# Patient Record
Sex: Male | Born: 1950 | Race: White | Hispanic: No | Marital: Married | Smoking: Never smoker
Health system: Southern US, Community
[De-identification: ages and names within clinical notes are randomized; demographics above are authoritative.]

## PROBLEM LIST (undated history)

## (undated) DIAGNOSIS — I6509 Occlusion and stenosis of unspecified vertebral artery: Secondary | ICD-10-CM

## (undated) DIAGNOSIS — D6851 Activated protein C resistance: Secondary | ICD-10-CM

## (undated) DIAGNOSIS — Z961 Presence of intraocular lens: Secondary | ICD-10-CM

## (undated) DIAGNOSIS — I639 Cerebral infarction, unspecified: Secondary | ICD-10-CM

## (undated) DIAGNOSIS — N401 Enlarged prostate with lower urinary tract symptoms: Secondary | ICD-10-CM

## (undated) DIAGNOSIS — N200 Calculus of kidney: Secondary | ICD-10-CM

## (undated) DIAGNOSIS — E291 Testicular hypofunction: Secondary | ICD-10-CM

## (undated) DIAGNOSIS — Z86018 Personal history of other benign neoplasm: Secondary | ICD-10-CM

## (undated) DIAGNOSIS — E785 Hyperlipidemia, unspecified: Secondary | ICD-10-CM

## (undated) DIAGNOSIS — E119 Type 2 diabetes mellitus without complications: Secondary | ICD-10-CM

## (undated) HISTORY — PX: TONSILLECTOMY: SUR1361

## (undated) HISTORY — DX: Personal history of other benign neoplasm: Z86.018

## (undated) HISTORY — PX: DENTAL EXAMINATION UNDER ANESTHESIA: SHX1447

## (undated) HISTORY — PX: BACK SURGERY: SHX140

---

## 2005-07-05 ENCOUNTER — Ambulatory Visit: Payer: Self-pay | Admitting: Urology

## 2006-07-31 ENCOUNTER — Ambulatory Visit: Payer: Self-pay | Admitting: Gastroenterology

## 2007-11-06 ENCOUNTER — Ambulatory Visit: Payer: Self-pay | Admitting: Internal Medicine

## 2008-01-30 ENCOUNTER — Ambulatory Visit: Payer: Self-pay

## 2009-09-28 ENCOUNTER — Ambulatory Visit: Payer: Self-pay | Admitting: Gastroenterology

## 2010-06-08 ENCOUNTER — Ambulatory Visit: Payer: Self-pay | Admitting: Urology

## 2010-06-17 ENCOUNTER — Ambulatory Visit: Payer: Self-pay | Admitting: Urology

## 2010-07-07 ENCOUNTER — Ambulatory Visit: Payer: Self-pay | Admitting: Urology

## 2010-07-26 ENCOUNTER — Ambulatory Visit: Payer: Self-pay | Admitting: Urology

## 2010-09-05 ENCOUNTER — Ambulatory Visit: Payer: Self-pay | Admitting: Urology

## 2010-09-08 ENCOUNTER — Ambulatory Visit: Payer: Self-pay | Admitting: Urology

## 2010-09-20 ENCOUNTER — Ambulatory Visit: Payer: Self-pay | Admitting: Urology

## 2011-06-07 ENCOUNTER — Ambulatory Visit: Payer: Self-pay | Admitting: Urology

## 2012-02-13 ENCOUNTER — Observation Stay: Payer: Self-pay | Admitting: Internal Medicine

## 2012-02-13 LAB — CBC
HGB: 14.4 g/dL (ref 13.0–18.0)
MCH: 30.1 pg (ref 26.0–34.0)
MCV: 87 fL (ref 80–100)
Platelet: 191 10*3/uL (ref 150–440)
RBC: 4.79 10*6/uL (ref 4.40–5.90)
WBC: 8.3 10*3/uL (ref 3.8–10.6)

## 2012-02-13 LAB — BASIC METABOLIC PANEL
Calcium, Total: 8.7 mg/dL (ref 8.5–10.1)
Chloride: 104 mmol/L (ref 98–107)
Co2: 24 mmol/L (ref 21–32)
EGFR (African American): >60
EGFR (Non-African Amer.): >60
Glucose: 235 mg/dL — ABNORMAL HIGH (ref 65–99)
Osmolality: 284 (ref 275–301)

## 2012-02-13 LAB — TROPONIN I: Troponin-I: <0.02 ng/mL

## 2012-02-29 DIAGNOSIS — I6509 Occlusion and stenosis of unspecified vertebral artery: Secondary | ICD-10-CM | POA: Insufficient documentation

## 2012-02-29 DIAGNOSIS — E785 Hyperlipidemia, unspecified: Secondary | ICD-10-CM | POA: Insufficient documentation

## 2012-02-29 DIAGNOSIS — I10 Essential (primary) hypertension: Secondary | ICD-10-CM | POA: Insufficient documentation

## 2012-02-29 DIAGNOSIS — I639 Cerebral infarction, unspecified: Secondary | ICD-10-CM | POA: Insufficient documentation

## 2012-05-24 DIAGNOSIS — E119 Type 2 diabetes mellitus without complications: Secondary | ICD-10-CM | POA: Insufficient documentation

## 2012-07-12 DIAGNOSIS — D6851 Activated protein C resistance: Secondary | ICD-10-CM | POA: Insufficient documentation

## 2012-09-02 DIAGNOSIS — N529 Male erectile dysfunction, unspecified: Secondary | ICD-10-CM | POA: Insufficient documentation

## 2012-09-02 DIAGNOSIS — E291 Testicular hypofunction: Secondary | ICD-10-CM | POA: Insufficient documentation

## 2012-09-02 DIAGNOSIS — R81 Glycosuria: Secondary | ICD-10-CM | POA: Insufficient documentation

## 2013-03-06 ENCOUNTER — Ambulatory Visit: Payer: Self-pay | Admitting: Gastroenterology

## 2013-03-07 LAB — PATHOLOGY REPORT

## 2013-04-03 IMAGING — CT CT ANGIO HEAD-NECK
1 of 5 series · 12 of 33 positions shown · IV contrast (APPLIED)
Comparison: none

REASON FOR EXAM: acute onset of worst headache of life, Nausea,
Vomitting, nystagmus, left hemibo
COMMENTS:

PROCEDURE:     CT  - CT ANGIOGRAPHY HEAD NECK W/WO  - February 13, 2012 [DATE]
RESULT:
TECHNIQUE: Axial and coronal source imaging was obtained of the head and
neck regions utilizing helical 3 mm acquisition status post intravenous
administration of 100 ml of Nsovue-W9C.

[Series 4: soft tissue · axial · 0.54mm/px · z∈[-389,-62]mm · 12 of 129 slices shown]
[im 10/129  soft-tissue]
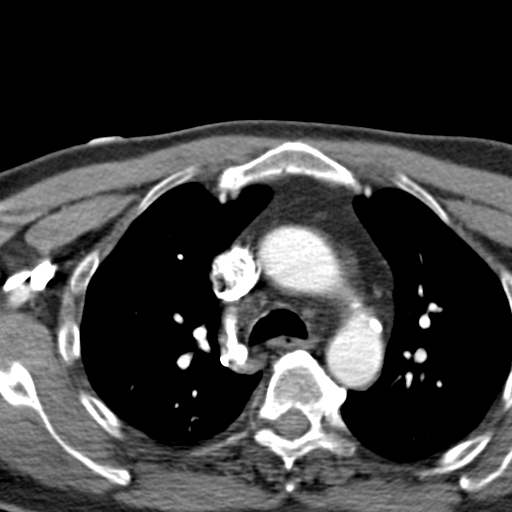
[im 20/129  bone]
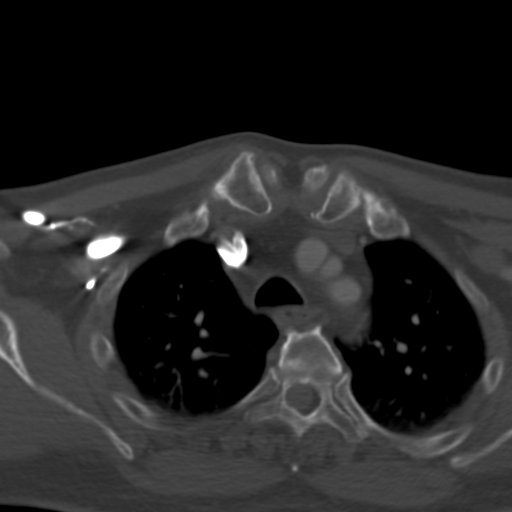
[im 30/129  soft-tissue]
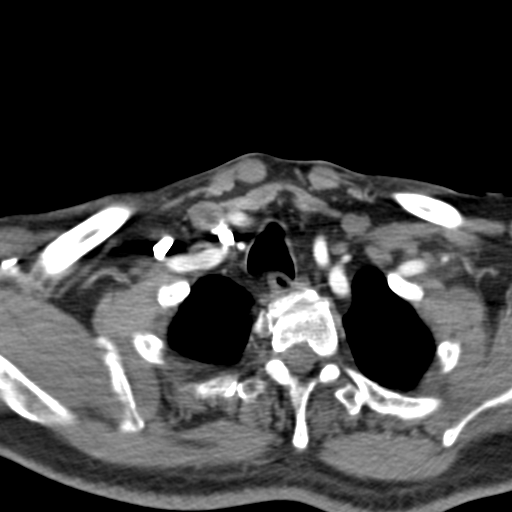
[im 40/129  bone]
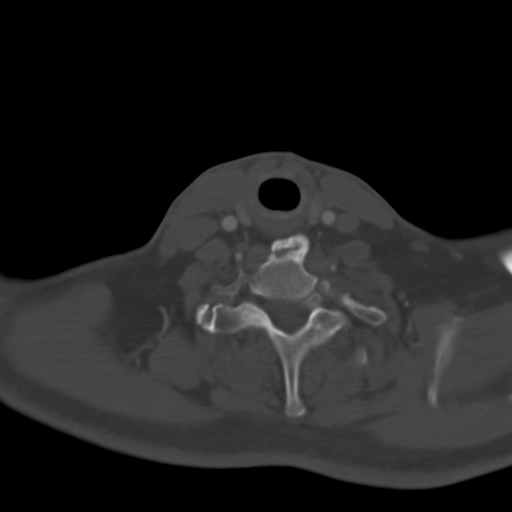
[im 50/129  soft-tissue]
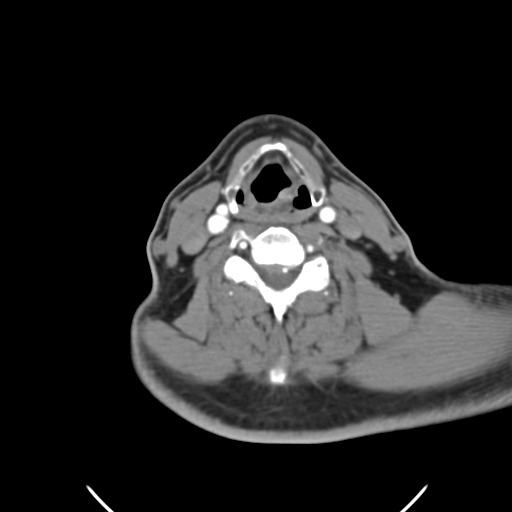
[im 60/129  bone]
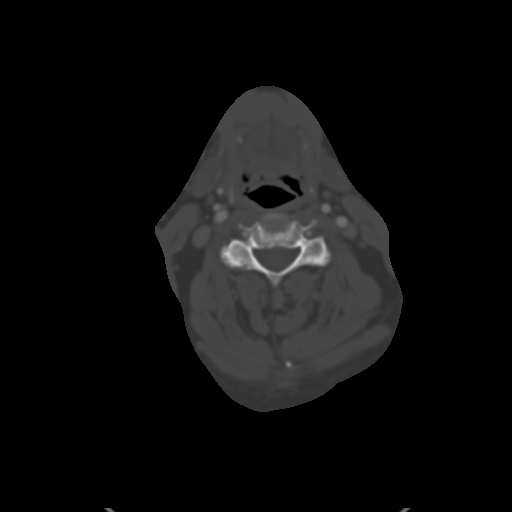
[im 69/129  soft-tissue]
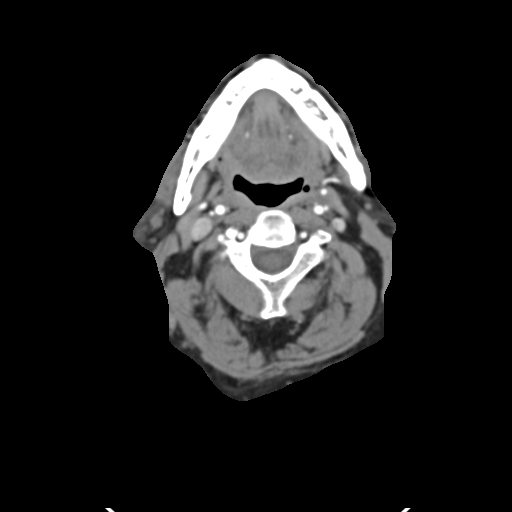
[im 79/129  bone]
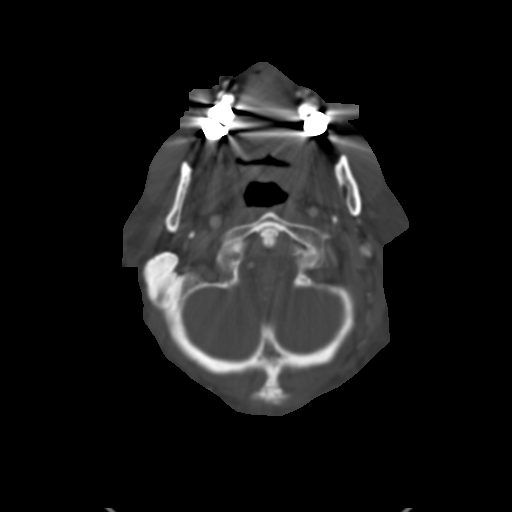
[im 89/129  soft-tissue]
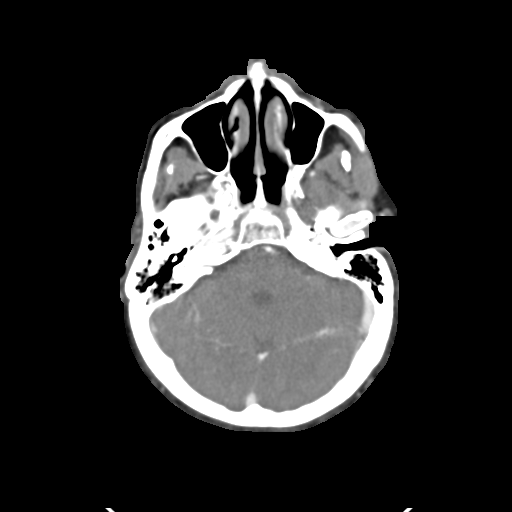
[im 99/129  bone]
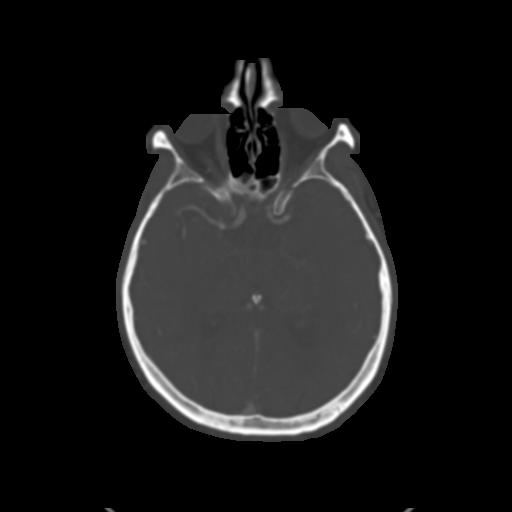
[im 109/129  soft-tissue]
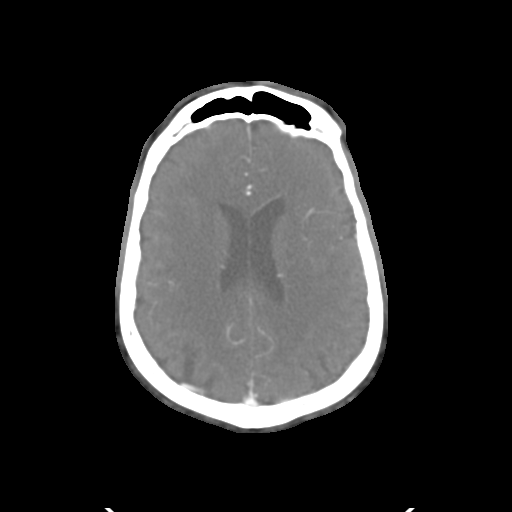
[im 119/129  bone]
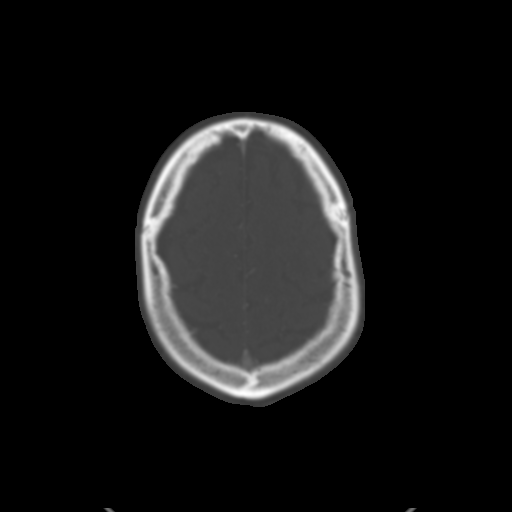

[12 of 33 positions shown; findings below may reference images not displayed]

FINDINGS: There is normal opacification of the carotid and vertebral
arteries with no evidence of stenosis or dissection. No intracranial
stenosis or aneurysmal dilatation is seen. Patent anterior and bilateral
posterior communicating arteries. No abnormal vessel trifurcation is noted.
There is normal opacification of the dominant dural venous sinuses. No
abnormal intracranial enhancement or hydronephrosis appreciated. This is
within the limitations of post contrast imaging. There is no evidence of
intracranial hemorrhage.

No gross osseous abnormalities identified. Degenerative changes are
identified within the cervical spine. The lung apices are clear. No cervical
soft tissue abnormalities are detected.
IMPRESSION: 1.  No acute abnormality detected.
2.  Preliminary faxed report was relayed to the patient's floor on 02/13/2012
at [DATE].

## 2013-06-23 DIAGNOSIS — Z961 Presence of intraocular lens: Secondary | ICD-10-CM | POA: Insufficient documentation

## 2013-08-05 DIAGNOSIS — Z961 Presence of intraocular lens: Secondary | ICD-10-CM | POA: Insufficient documentation

## 2014-11-24 DIAGNOSIS — Z87442 Personal history of urinary calculi: Secondary | ICD-10-CM | POA: Insufficient documentation

## 2014-11-26 ENCOUNTER — Ambulatory Visit: Payer: Self-pay | Admitting: Internal Medicine

## 2015-02-21 NOTE — Discharge Summary (Signed)
PATIENT NAME:  Ryan Hicks, Ryan Hicks MR#:  [id number] DATE OF BIRTH:  [birth year]  DATE OF ADMISSION:  02/13/2012 DATE OF DISCHARGE:  02/14/2012  HISTORY OF PRESENT ILLNESS: Ryan Hicks was a 64 year old white married gentleman who presented to the emergency room after the onset of severe neck and head pain that occurred while he was at work. He felt like the pain radiated into his left eye. He developed extreme vertigo with nausea and vomiting. He also felt like he was numb on the left side of his face. He presented to the emergency room where his symptoms with the exception of the nausea and vomiting had essentially stopped. He did have a head CT in the emergency room that did not show any evidence of a bleed.   PAST MEDICAL HISTORY:  1. Diabetes.  2. Hyperlipidemia.  3. History of herpes zoster. 4. History of hepatitis A.  5. History of nephrolithiasis.  6. Benign prostatic hypertrophy.   PAST SURGICAL HISTORY:  1. Tonsillectomy.  2. Left breast biopsy.  3. Bilateral wrist surgery due to tendon repair.  4. Lumbar laminectomy.   ADMISSION PHYSICAL EXAMINATION: The patient's physical examination revealed a temperature of 97.5, pulse 59, respiratory rate 18, and blood pressure initially of 183/94. Pulse oximetry was 95% on room air. Exam was essentially unremarkable with the exception of the neurological when he initially presented with some left lateral gaze. There is however resolved and at the time of admission was able to keep a midline gaze. His extraocular movements were intact. Notable was the fact that his neck was supple.   HOSPITAL COURSE: The patient was admitted initially to observation. He was seen on an urgent basis by Dr. Manuella Ghazi from neurology. It was Dr. Trena Platt opinion that he most likely had had a brain stem infarct. A CTA was scheduled which was eventually shown to be normal. The patient was therefore scheduled for a MRI the following morning.   Due to the patient's apprehension and  wishes of the family arrangements were made for the patient to be transferred to [facility]. This occurred early in the morning on 02/14/2012.   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 ____________________________ Hewitt Blade. Sarina Ser, MD jbw:slb D: 02/28/2012 12:10:46 ET T: 02/28/2012 12:28:05 ET JOB#: [id number]  cc: Jenny Reichmann B. Sarina Ser, MD, <Dictator> Lottie Mussel III MD ELECTRONICALLY SIGNED 02/28/2012 14:30

## 2015-02-21 NOTE — Consult Note (Signed)
PATIENT NAME:  Ryan Hicks, Ryan Hicks MR#:  [id number] DATE OF BIRTH:  1951/09/17  DATE OF CONSULTATION:  02/13/2012  REFERRING PHYSICIAN:  Dr. Lisette Grinder  CONSULTING PHYSICIAN:  Hemang K. Manuella Ghazi, MD  REASON FOR CONSULTATION: Worst headache of the life and possible left facial weakness.    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 on 02/13/2012.   This was the worst headache of his left. Developed some numbness on the left side of his face.   He cannot tell me exactly when the incoordination of the left side of the body started but he has noticed that his left side is just not quite right. He feels very tired, nausea and vomiting. He feels like he has to keep his eyes closed. He could not tell me early on but later in the exam it seemed like he had dizziness and sensation of movement if he keeps his eyes open. He doesn't has significant sensitivity to light.   There is no change in his hearing or speech or swallowing. He has not noticed any weakness on the right side of the body but he feels like his balance is not good when he tried to sit up in the bed.   He has not noticed any difficulty with his bowel or bladder but he has not needed to urinate yet but he has passed gas per patient.   He has not had any fever or rash. No recent head trauma. He was not in any acute distress. He does not have known high blood pressure. He was noted to be recently started on a new medicine, benazepril, which has been stopped.   Patient does not have family history of cerebral aneurysm as far as he can tell.   He does not have any neck manipulations done, etc.   PAST MEDICAL HISTORY:  1. Benign prostatic hypertrophy.  2. History of kidney stone.  3. Hepatitis A.   4. Herpes  zoster. 5. Diabetes. 6. Hyperlipidemia.   PAST SURGICAL HISTORY:  1. Colonoscopy in 2010. 2. Lumbar spinal surgery due to herniated disk. 3. Bilateral wrist surgery due to tendon repair.  4. Left breast  biopsy and tumor resection which was benign.  5. Tonsillectomy.   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 He does not smoke. He does not drink alcohol. He lives with his wife. His kids and other family members are spread all over the country.    FAMILY HISTORY: Significant father had prostate cancer. Mother had diabetes. She also had hypothyroidism. He has a sister with gestational diabetes.   REVIEW OF SYSTEMS: Positive for severe headache, nausea, vomiting, feeling of dizziness, oscillating sensation if he keeps his eyes open, generalized fatigue, numbness on the left side of the face, incoordination of the left side of the body. Rest of the 10 system review of systems was asked and was found to be negative.   PHYSICAL EXAMINATION:  VITAL SIGNS: Temperature 97.5, pulse 59, respiratory rate 18, blood pressure 183/94, pulse oximetry 95%.   GENERAL: He is a middle-aged Caucasian gentleman lying in bed. His wife at the bedside. He is keeping his eyes closed. The lights were turned off in the room.   LUNGS: Clear to auscultation.   HEART: S1, S2 heart sounds.  NECK: Carotid examination did not reveal any bruit.   He has significant tenderness to touch in his inion just left lateral to it.   His neck is supple.   He has negative Kernig and Brudzinski's sign.   NEUROLOGIC: Examination with his eyes closed mental status he was alert, he was oriented to today's date, day, month, year, place, location, room #141, etc. He knew the current president Obama, previous president Tawni Pummel and he missed the [employer] and said [place] and then [place].   He was able to follow two-step inverted commands (point to the ceiling after pointing to the floor and tiger was killed by lion tell me  who is dead).   His attention, concentration and cognition seems to be intact for his age and medical condition.   He seems to be a little bit apprehensive and worried about stroke.   CRANIAL NERVES: His pupils are reactive but he has a left gaze deviation. He has a hard time keeping the gaze into the center. He can voluntarily do right gaze but cannot persist it.    In his essential gaze he has rotatory nystagmus spontaneous with some horizontal component to it.   He keeps his eyes closed as keeping his eyes open leaves him feeling of oscillopsia.   It was very difficult to check for his visual field as he cannot fixate.   He seemed to have some visual impairment on his right side hemisphere. It was difficult to say.  He does have mild left lower facial weakness. He does have decreased sensation to light touch on the left side of the face. His hearing seemed to be intact. His shoulder shrug seemed to be intact.   On his motor exam he had normal tone. His strength seemed to be actually 5/5 in both upper and lower extremities but he has poor dexterity of his left upper extremity.   On his coordination he had a very poor finger-to-nose on the left compared to the right.   The same as he had difficulty with doing circle motion of his left foot in the air.   His deep tendon reflexes were +2. His toes were mute.   His sensations were intact to light touch but he has some impairment of temperature sensation which he said right side feels warmer than the left side with the tuning fork (side of the tuning fork).  While trying to sit up in the bed he had difficulty due to truncal ataxia.   LABORATORY, DIAGNOSTIC AND RADIOLOGICAL DATA: The CT scan of the head was unremarkable. He does have some intracranial atherosclerosis but no significant previous infarct or space-occupying lesion.   ASSESSMENT AND PLAN: Acute onset of worst headache of the life with negative CT scan of the head and focal  neurological deficit (such as left lower facial weakness and left face numbness, rotational nystagmus with left gaze deviation, left hemibody ataxia and some temperature sensation involvement).   Will obtain CT angio of the cervical disk to look for aneurysm.   Due to his localization into the brainstem and cerebellum I am also concerned about the dissection with his progression of headache. Will obtain CT angiogram of his carotid and vertebral vessels.   I called the radiologist on-call who graciously accepted to do this study on a STAT basis.    I talked to the patient and wife about why we are not giving him TPA as they were interested in giving him thrombus medication but he is  a very high risk for hemorrhage and there is potential for subarachnoid hemorrhage.   I did offer them lumbar puncture, but right now will do the aneurysm and dissection studies on family's direction as well.    I talked to them that we are holding off on any antiplatelet as well due to concern for dissection or subarachnoid hemorrhage.   If he happens to have dissection then will consider starting IV heparin.   If he has aneurysm then he might need to get transferred to tertiary care facility where he can have some intervention done.   He has had MRI of the brain ordered.   I will follow up these studies tonight. I talked to Dr. Gilford Rile on the phone and nurse as well as family.   TIME SPENT: I spent 75 minutes of critical care time with this patient who has a potential life threatening event (brain stem ischemia).   Feel free to contact me with any further questions.  ____________________________ Royetta Crochet. Manuella Ghazi, MD hks:cms D: 02/13/2012 22:18:00 ET T: 02/14/2012 07:40:35 ET JOB#: [id number]  cc: Hemang K. Manuella Ghazi, MD, <Dictator> John B. Sarina Ser, MD Maudry Diego Raliegh Ip [facility] MD ELECTRONICALLY SIGNED 02/25/2012 21:25

## 2015-02-21 NOTE — H&P (Signed)
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 He states that it radiated into the left side of his head and into his left eye. He developed nausea and vomiting and presented to the Emergency Room for evaluation. CT scan of the head in the Emergency Room was negative for acute bleed. Apparently there was some question by the ER physician of a mild left facial droop. Admission was therefore requested.   PAST MEDICAL HISTORY:  1. Type 2 diabetes.  2. Hyperlipidemia.  3. Benign prostatic hypertrophy.  4. Obesity.  5. History of nephrolithiasis.  6. History of hepatitis A.  7. History of herpes zoster.   PAST SURGICAL HISTORY:  1. Tonsillectomy.  2. Bilateral wrist surgery for tendon repair.  3. Lumbar spinal surgery.   MEDICATIONS ON ADMISSION:  1. Flomax 0.4 mg daily.  2. Metformin 1000 mg b.i.d.  3. Simvastatin 40 mg at bedtime.   ALLERGIES: No known drug allergies.   SOCIAL HISTORY: He is the Theme park manager at [employer]. He does not smoke. He is a social drinker.   FAMILY HISTORY: The patient's family history is notable that his father had prostate cancer. His mother had diabetes. She also had hypothyroidism. He has a sister who had gestational diabetes. There is no history of cerebrovascular accident. There is no history of migraine.   REVIEW OF SYSTEMS: The patient's review of systems is essentially unremarkable with the exception of the present illness.   PHYSICAL EXAMINATION:  VITAL SIGNS:  Temperature 97.8, pulse 70, blood pressure 163/96, and a pulse oximetry of 94.   GENERAL: This is a middle-aged gentleman who looks like he feels poorly, although his nausea and headache have for the most part subsided.   SKIN: Normal in  color and texture. There is no lymphadenopathy.   HEENT: Examination of head, ears, eyes, nose and throat is grossly within normal limits.   NECK: The neck is supple. There are no carotid bruits.   LUNGS: The lung fields are clear to auscultation.   CARDIAC: Exam reveals a regular rhythm. There are no murmurs or gallops. S1, S2 were normal.   ABDOMEN: Soft and nontender. Liver and spleen are not enlarged. Bowel sounds are normal.   GENITAL/RECTAL: Deferred.   EXTREMITIES: No edema or deformity.   NEUROLOGIC: Exam shows the cranial nerves to be grossly intact. I do not see any obvious nerve palsies. The patient moves all extremities well. Grip strength is normal in both hands. There are no Babinski's.   LABORATORY, DIAGNOSTIC AND RADIOLOGICAL DATA:  The patient's unenhanced head CT showed no acute changes.  Admission CBC shows a hemoglobin of 14.4, with a hematocrit of 41.8. White count is 8300. Platelet count is 191,000.  Admission basic metabolic panel shows a random blood sugar of 235.  Troponin is less than 0.02.   IMPRESSION: Acute onset of severe headache of undetermined etiology.   PLAN: The patient is being admitted initially to Observation. He will be placed on full liquids to make sure his nausea is completely cleared. He will be placed on a sliding scale of insulin until his nausea has resolved. His routine medications will be held until we are sure his nausea has resolved. He we will have neuro checks every  two hours. Neurology consultation will be requested. The MRI is scheduled for in the morning.   ____________________________ Hewitt Blade. Sarina Ser, MD jbw:cbb D: 02/13/2012 17:52:04 ET T: 02/13/2012 18:21:10 ET JOB#: [id number]  cc: Jenny Reichmann B. Sarina Ser, MD, <Dictator> Lottie Mussel III MD ELECTRONICALLY SIGNED 02/18/2012 17:30

## 2015-02-21 NOTE — Consult Note (Signed)
Referring Physician:  Madelyn Brunner :   Past Medical/Surgical Hx:  Renal Insufficiency:   Diabetes Mellitus, Type II (NIDD):   Home Medications: Medication Instructions Last Modified Date/Time  vitamin A 1 cap(s) orally once a day. **pt not sure of strength** 16-Apr-13 17:19  Vitamin B1 1 tab(s) orally once a day. **pt not sure of strength** 16-Apr-13 17:19  Vitamin C 500 mg oral tablet 1 tab(s) orally once a day 16-Apr-13 17:19  Vitamin D3 1 tab(s) orally once a day. **pt not sure of strength** 16-Apr-13 17:19  vitamin E 400 intl units oral capsule 1 cap(s) orally once a day 16-Apr-13 17:19  tamsulosin 0.4 mg oral capsule 1 cap(s) orally once a day.  **brand name flomax** 16-Apr-13 17:19  metformin 500 mg oral tablet 1 tab(s) orally 4 times a day 16-Apr-13 17:19  benazepril 20 mg oral tablet 1 tab(s) orally once a day 16-Apr-13 17:19   Allergies:  No Known Allergies:   Vital Signs: **Vital Signs.:   16-Apr-13 19:51   Vital Signs Type Admission   Temperature Temperature (F) 97.3   Celsius 36.2   Temperature Source oral   Pulse Pulse 58   Pulse source per Dinamap   Respirations Respirations 18   Systolic BP Systolic BP 010   Diastolic BP (mmHg) Diastolic BP (mmHg) 91   Mean BP 119   BP Source Dinamap   Pulse Ox % Pulse Ox % 95   Pulse Ox Activity Level  At rest   Oxygen Delivery Room Air/ 21 %   Lab Results: Routine Chem:  16-Apr-13 15:56    Glucose, Serum   235   BUN 9   Creatinine (comp) 0.77   Sodium, Serum 139   Potassium, Serum 4.2   Chloride, Serum 104   CO2, Serum 24   Calcium (Total), Serum 8.7   Anion Gap 11   Osmolality (calc) 284   eGFR (African American) >60   eGFR (Non-African American) >60  Cardiac:  16-Apr-13 15:56    Troponin I < 0.02  Routine Hem:  16-Apr-13 15:56    WBC (CBC) 8.3   RBC (CBC) 4.79   Hemoglobin (CBC) 14.4   Hematocrit (CBC) 41.8   Platelet Count (CBC) 191   MCV 87   MCH 30.1   MCHC 34.4   RDW 12.7   Radiology  Results: CT:    16-Apr-13 16:36, CT Head Without Contrast   CT Head Without Contrast    REASON FOR EXAM:    headache  COMMENTS:       PROCEDURE: CT  - CT HEAD WITHOUT CONTRAST  - Feb 13 2012  4:36PM     RESULT: Technique: Helical 10m sections were obtained from the skull base   to the vertex without administration of intravenous contrast.     Findings: There is not evidence of intra-axial fluid collections. There   is no evidence of acute hemorrhage or secondary signs reflecting mass   effect or subacute or chronic focal territorial infarction. The osseous   structures demonstrate no evidence of a depressed skull fracture. If   there is persistent concern clinical follow-up with MRI is recommended.    IMPRESSION:   1. No evidence of acute intracranial abnormalitites.          Verified By: HMikki Santee M.D., MD   Impression/Recommendations:  Recommendations:   Please see my dictation for details.Acute onset of left upper neck, occipital pain - worst headache of life, radiated to left eye, now  with left hemibody ataxia, mild left facial weakness, left hemifacial numbness, some abnormality of temp sensation, nystagmus, left gaze deviation, severe nausea vomitting, neck supple. non contrast CT head - no hemorrhage. for Sub Arachnoid Hemorrhage. lumbar puncture - family and pt will prefer to go with CTA first to rule out dissection / aneurysm. Has paged radiologist on call to get CTA done. renal function OK. CTA vertebral (dissection), circle of willis (aneurysm). Pt and family explained why not tPA and why no anti-platelets at present. for the opportunity to participate in care of your patient. spent 75 min of critical care time for this patient (>50% of the time couselling and co-ordination of care - calling Dr. Gilford Rile, radiologist, nurses etc).is critically ill and has potential for life threatening event due to brainstem ischemia / stroke.   Electronic Signatures: Ray Church (MD)  (Signed 16-Apr-13 20:50)  Authored: REFERRING PHYSICIAN, PAST MEDICAL/SURGICAL HISTORY, HOME MEDICATIONS, ALLERGIES, NURSING VITAL SIGNS, LAB RESULTS, RADIOLOGY RESULTS, Recommendations   Last Updated: 16-Apr-13 20:50 by Ray Church (MD)

## 2016-02-24 ENCOUNTER — Other Ambulatory Visit: Payer: Self-pay | Admitting: Orthopedic Surgery

## 2016-02-24 DIAGNOSIS — M25512 Pain in left shoulder: Secondary | ICD-10-CM

## 2016-03-01 ENCOUNTER — Ambulatory Visit
Admission: RE | Admit: 2016-03-01 | Discharge: 2016-03-01 | Disposition: A | Discharge status: Home / Self Care | Payer: BLUE CROSS/BLUE SHIELD | Source: Ambulatory Visit | Attending: Orthopedic Surgery | Admitting: Orthopedic Surgery

## 2016-03-01 DIAGNOSIS — M25512 Pain in left shoulder: Secondary | ICD-10-CM

## 2016-05-01 DIAGNOSIS — N200 Calculus of kidney: Secondary | ICD-10-CM | POA: Insufficient documentation

## 2017-08-20 DIAGNOSIS — Z86018 Personal history of other benign neoplasm: Secondary | ICD-10-CM

## 2017-08-20 HISTORY — DX: Personal history of other benign neoplasm: Z86.018

## 2018-04-01 ENCOUNTER — Ambulatory Visit (INDEPENDENT_AMBULATORY_CARE_PROVIDER_SITE_OTHER): Payer: Medicare PPO | Admitting: Urology

## 2018-04-01 ENCOUNTER — Encounter: Payer: Self-pay | Admitting: Urology

## 2018-04-01 VITALS — BP 147/77 | HR 88 | Ht 72.0 in | Wt 237.0 lb

## 2018-04-01 DIAGNOSIS — N401 Enlarged prostate with lower urinary tract symptoms: Secondary | ICD-10-CM | POA: Insufficient documentation

## 2018-04-01 DIAGNOSIS — N138 Other obstructive and reflux uropathy: Secondary | ICD-10-CM | POA: Insufficient documentation

## 2018-04-01 NOTE — Progress Notes (Signed)
04/01/2018 9:12 AM   Ryan Hicks Oct 13, 1951 [id number]  Referring provider: No referring provider defined for this encounter.  Chief Complaint  Patient presents with  . Benign Prostatic Hypertrophy   Urologic problem list: -BPH with lower urinary tract symptoms; tamsulosin prn  HPI: 67 year old male presents for annual follow-up.  He takes tamsulosin as needed for postvoid dribbling.  A PSA performed with his PCP January 2019 was stable at 0.71.  He has no complaints today.   PMH: History reviewed. No pertinent past medical history.  Surgical History: History reviewed. No pertinent surgical history.  Home Medications:  Allergies as of 04/01/2018   No Known Allergies     Medication List        Accurate as of 04/01/18  9:12 AM. Always use your most recent med list.          aspirin EC 81 MG tablet Take by mouth.   atorvastatin 80 MG tablet Commonly known as:  LIPITOR TAKE 1 TABLET BY MOUTH EVERY NIGHT   benazepril 40 MG tablet Commonly known as:  LOTENSIN   clobetasol 0.05 % external solution Commonly known as:  TEMOVATE   DELTA D3 400 units Tabs tablet Generic drug:  cholecalciferol Take by mouth.   DENTA 5000 PLUS 1.1 % Crea dental cream Generic drug:  sodium fluoride   ELIDEL 1 % cream Generic drug:  pimecrolimus   FISH OIL MAXIMUM STRENGTH 1200 MG Cpdr Take by mouth.   fluticasone 50 MCG/ACT nasal spray Commonly known as:  FLONASE Place into the nose.   glipiZIDE 10 MG tablet Commonly known as:  GLUCOTROL   JANUVIA 25 MG tablet Generic drug:  sitaGLIPtin   Magnesium 250 MG Tabs Take by mouth.   metFORMIN 1000 MG tablet Commonly known as:  GLUCOPHAGE   MULTI-VITAMINS Tabs Take by mouth.   omeprazole 20 MG capsule Commonly known as:  PRILOSEC Take by mouth.   OXISTAT 1 % lotion Generic drug:  oxiconazole   tamsulosin 0.4 MG Caps capsule Commonly known as:  FLOMAX   VIAGRA 100 MG tablet Generic drug:  sildenafil Take by  mouth.   vitamin A 8000 UNIT capsule Take by mouth.   vitamin B-12 1000 MCG tablet Commonly known as:  CYANOCOBALAMIN Take by mouth.   vitamin C 500 MG tablet Commonly known as:  ASCORBIC ACID Take by mouth.       Allergies: No Known Allergies  Family History: History reviewed. No pertinent family history.  Social History:  reports that he has never smoked. He has never used smokeless tobacco. He reports that he does not drink alcohol or use drugs.  ROS: UROLOGY Frequent Urination?: No Hard to postpone urination?: No Burning/pain with urination?: No Get up at night to urinate?: No Leakage of urine?: No Urine stream starts and stops?: No Trouble starting stream?: No Do you have to strain to urinate?: No Blood in urine?: No Urinary tract infection?: No Sexually transmitted disease?: No Injury to kidneys or bladder?: No Painful intercourse?: No Weak stream?: No Erection problems?: No Penile pain?: No  Gastrointestinal Nausea?: No Vomiting?: No Indigestion/heartburn?: No Diarrhea?: No Constipation?: No  Constitutional Fever: No Night sweats?: No Weight loss?: No Fatigue?: No  Skin Skin rash/lesions?: No Itching?: No  Eyes Blurred vision?: No Double vision?: No  Ears/Nose/Throat Sore throat?: No Sinus problems?: No  Hematologic/Lymphatic Swollen glands?: No Easy bruising?: No  Cardiovascular Leg swelling?: No Chest pain?: No  Respiratory Cough?: No Shortness of breath?: No  Endocrine Excessive  thirst?: No  Musculoskeletal Back pain?: No Joint pain?: No  Neurological Headaches?: No Dizziness?: No  Psychologic Depression?: No Anxiety?: No  Physical Exam: BP (!) 147/77 (BP Location: Left Arm, Patient Position: Sitting, Cuff Size: Large)   Pulse 88   Ht 6' (1.829 m)   Wt 237 lb (107.5 kg)   SpO2 99%   BMI 32.14 kg/m   Constitutional:  Alert and oriented, No acute distress. HEENT: [place] AT, moist mucus membranes.  Trachea  midline, no masses. Cardiovascular: No clubbing, cyanosis, or edema. Respiratory: Normal respiratory effort, no increased work of breathing. GI: Abdomen is soft, nontender, nondistended, no abdominal masses GU: No CVA tenderness.  Prostate 50 g, smooth without nodules. Lymph: No cervical or inguinal lymphadenopathy. Skin: No rashes, bruises or suspicious lesions. Neurologic: Grossly intact, no focal deficits, moving all 4 extremities. Psychiatric: Normal mood and affect.  Assessment & Plan:   66 year old male with BPH/lower urinary tract symptoms.  DRE benign and PSA remains low.  He will continue tamsulosin as needed.  Continue annual follow-up.  Return in about 1 year (around 04/02/2019) for Recheck.  Abbie Sons, [place] [address], [place], [place] [address] [phone]

## 2018-07-22 ENCOUNTER — Encounter: Payer: Self-pay | Admitting: *Deleted

## 2018-07-23 ENCOUNTER — Ambulatory Visit: Payer: Medicare PPO | Admitting: Certified Registered Nurse Anesthetist

## 2018-07-23 ENCOUNTER — Encounter
Admission: RE | Disposition: A | Discharge status: Home / Self Care | Payer: Self-pay | Source: Ambulatory Visit | Attending: Internal Medicine

## 2018-07-23 ENCOUNTER — Ambulatory Visit
Admission: RE | Admit: 2018-07-23 | Discharge: 2018-07-23 | Disposition: A | Discharge status: Home / Self Care | Payer: Medicare PPO | Source: Ambulatory Visit | Attending: Internal Medicine | Admitting: Internal Medicine

## 2018-07-23 ENCOUNTER — Encounter: Payer: Self-pay | Admitting: *Deleted

## 2018-07-23 DIAGNOSIS — I10 Essential (primary) hypertension: Secondary | ICD-10-CM | POA: Diagnosis not present

## 2018-07-23 DIAGNOSIS — Z79899 Other long term (current) drug therapy: Secondary | ICD-10-CM | POA: Diagnosis not present

## 2018-07-23 DIAGNOSIS — Z8673 Personal history of transient ischemic attack (TIA), and cerebral infarction without residual deficits: Secondary | ICD-10-CM | POA: Diagnosis not present

## 2018-07-23 DIAGNOSIS — Z86010 Personal history of colonic polyps: Secondary | ICD-10-CM | POA: Insufficient documentation

## 2018-07-23 DIAGNOSIS — D6851 Activated protein C resistance: Secondary | ICD-10-CM | POA: Insufficient documentation

## 2018-07-23 DIAGNOSIS — Z7984 Long term (current) use of oral hypoglycemic drugs: Secondary | ICD-10-CM | POA: Diagnosis not present

## 2018-07-23 DIAGNOSIS — E785 Hyperlipidemia, unspecified: Secondary | ICD-10-CM | POA: Insufficient documentation

## 2018-07-23 DIAGNOSIS — Z09 Encounter for follow-up examination after completed treatment for conditions other than malignant neoplasm: Secondary | ICD-10-CM | POA: Diagnosis present

## 2018-07-23 DIAGNOSIS — K5730 Diverticulosis of large intestine without perforation or abscess without bleeding: Secondary | ICD-10-CM | POA: Insufficient documentation

## 2018-07-23 DIAGNOSIS — N401 Enlarged prostate with lower urinary tract symptoms: Secondary | ICD-10-CM | POA: Insufficient documentation

## 2018-07-23 DIAGNOSIS — E291 Testicular hypofunction: Secondary | ICD-10-CM | POA: Insufficient documentation

## 2018-07-23 DIAGNOSIS — E119 Type 2 diabetes mellitus without complications: Secondary | ICD-10-CM | POA: Diagnosis not present

## 2018-07-23 DIAGNOSIS — Z7982 Long term (current) use of aspirin: Secondary | ICD-10-CM | POA: Diagnosis not present

## 2018-07-23 HISTORY — DX: Testicular hypofunction: E29.1

## 2018-07-23 HISTORY — DX: Activated protein C resistance: D68.51

## 2018-07-23 HISTORY — PX: COLONOSCOPY WITH PROPOFOL: SHX5780

## 2018-07-23 HISTORY — DX: Hyperlipidemia, unspecified: E78.5

## 2018-07-23 HISTORY — DX: Calculus of kidney: N20.0

## 2018-07-23 HISTORY — DX: Benign prostatic hyperplasia with lower urinary tract symptoms: N40.1

## 2018-07-23 HISTORY — DX: Cerebral infarction, unspecified: I63.9

## 2018-07-23 HISTORY — DX: Presence of intraocular lens: Z96.1

## 2018-07-23 HISTORY — DX: Occlusion and stenosis of unspecified vertebral artery: I65.09

## 2018-07-23 HISTORY — DX: Type 2 diabetes mellitus without complications: E11.9

## 2018-07-23 LAB — GLUCOSE, CAPILLARY: GLUCOSE-CAPILLARY: 233 mg/dL — AB (ref 70–99)

## 2018-07-23 SURGERY — COLONOSCOPY WITH PROPOFOL
Anesthesia: General

## 2018-07-23 MED ORDER — PROPOFOL 500 MG/50ML IV EMUL
INTRAVENOUS | Status: DC | PRN
  Administered 2018-07-23: 175 ug/kg/min via INTRAVENOUS

## 2018-07-23 MED ORDER — PROPOFOL 10 MG/ML IV BOLUS
INTRAVENOUS | Status: DC | PRN
  Administered 2018-07-23: 50 mg via INTRAVENOUS

## 2018-07-23 MED ORDER — PROPOFOL 500 MG/50ML IV EMUL
INTRAVENOUS | Status: AC
  Filled 2018-07-23: qty 50

## 2018-07-23 MED ORDER — SODIUM CHLORIDE 0.9 % IV SOLN
INTRAVENOUS | Status: DC
  Administered 2018-07-23: 10:00:00 via INTRAVENOUS

## 2018-07-23 MED ORDER — LIDOCAINE HCL (CARDIAC) PF 100 MG/5ML IV SOSY
PREFILLED_SYRINGE | INTRAVENOUS | Status: DC | PRN
  Administered 2018-07-23: 50 mg via INTRAVENOUS

## 2018-07-23 NOTE — Anesthesia Procedure Notes (Signed)
Date/Time: 07/23/2018 11:08 AM Performed by: Johnna Acosta, CRNA Pre-anesthesia Checklist: Patient identified, Emergency Drugs available, Suction available, Patient being monitored and Timeout performed Patient Re-evaluated:Patient Re-evaluated prior to induction Oxygen Delivery Method: Nasal cannula Preoxygenation: Pre-oxygenation with 100% oxygen Induction Type: IV induction

## 2018-07-23 NOTE — Anesthesia Postprocedure Evaluation (Signed)
Anesthesia Post Note  Patient: Ryan Hicks  Procedure(s) Performed: COLONOSCOPY WITH PROPOFOL (N/A )  Patient location during evaluation: PACU Anesthesia Type: General Level of consciousness: awake and alert Pain management: pain level controlled Vital Signs Assessment: post-procedure vital signs reviewed and stable Respiratory status: spontaneous breathing, nonlabored ventilation, respiratory function stable and patient connected to nasal cannula oxygen Cardiovascular status: blood pressure returned to baseline and stable Postop Assessment: no apparent nausea or vomiting Anesthetic complications: no     Last Vitals:  Vitals:   07/23/18 1200 07/23/18 1206  BP:  116/86  Pulse: 61 64  Resp: 14 14  Temp:    SpO2: 96% 97%    Last Pain:  Vitals:   07/23/18 1135  TempSrc: Tympanic  PainSc:                  Durenda Hurt

## 2018-07-23 NOTE — Op Note (Signed)
[facility] Gastroenterology Patient Name: Ryan Hicks Procedure Date: 07/23/2018 11:05 AM MRN: [id number] Account #: [id number] Date of Birth: 03/14/1951 Admit Type: Outpatient Age: 67 Room: [facility] ENDO ROOM 2 Gender: Male Note Status: Finalized Procedure:            Colonoscopy Indications:          High risk colon cancer surveillance: Personal history                        of colonic polyps Providers:            Benay Pike. Tavon Magnussen MD, MD Medicines:            Propofol per Anesthesia Complications:        No immediate complications. Procedure:            Pre-Anesthesia Assessment:                       - The risks and benefits of the procedure and the                        sedation options and risks were discussed with the                        patient. All questions were answered and informed                        consent was obtained.                       - Patient identification and proposed procedure were                        verified prior to the procedure by the nurse. The                        procedure was verified in the procedure room.                       - ASA Grade Assessment: II - A patient with mild                        systemic disease.                       - After reviewing the risks and benefits, the patient                        was deemed in satisfactory condition to undergo the                        procedure.                       After obtaining informed consent, the colonoscope was                        passed under direct vision. Throughout the procedure,                        the patient's blood pressure, pulse, and oxygen  saturations were monitored continuously. The                        Colonoscope was introduced through the anus and                        advanced to the the cecum, identified by appendiceal                        orifice and ileocecal valve. The colonoscopy was         performed without difficulty. The patient tolerated the                        procedure well. The quality of the bowel preparation                        was good. Findings:      The perianal and digital rectal examinations were normal. Pertinent       negatives include normal sphincter tone and no palpable rectal lesions.      Many small-mouthed diverticula were found in the sigmoid colon.      The exam was otherwise without abnormality on direct and retroflexion       views. Impression:           - Diverticulosis in the sigmoid colon.                       - The examination was otherwise normal on direct and                        retroflexion views.                       - No specimens collected. Recommendation:       - Patient has a contact number available for                        emergencies. The signs and symptoms of potential                        delayed complications were discussed with the patient.                        Return to normal activities tomorrow. Written discharge                        instructions were provided to the patient.                       - Resume previous diet.                       - Continue present medications.                       - Repeat colonoscopy in 10 years for screening purposes.                       - Return to GI office PRN.                       -  The findings and recommendations were discussed with                        the patient and their spouse. Procedure Code(s):    --- Professional ---                       Y8185, Colorectal cancer screening; colonoscopy on                        individual at high risk Diagnosis Code(s):    --- Professional ---                       K57.30, Diverticulosis of large intestine without                        perforation or abscess without bleeding                       Z86.010, Personal history of colonic polyps CPT copyright 2017 American Medical Association. All rights reserved. The  codes documented in this report are preliminary and upon coder review may  be revised to meet current compliance requirements. Efrain Sella MD, MD 07/23/2018 11:33:41 AM This report has been signed electronically. Number of Addenda: 0 Note Initiated On: 07/23/2018 11:05 AM Scope Withdrawal Time: 0 hours 7 minutes 49 seconds  Total Procedure Duration: 0 hours 17 minutes 8 seconds       [facility]

## 2018-07-23 NOTE — Interval H&P Note (Signed)
History and Physical Interval Note:  07/23/2018 10:58 AM  Ryan Hicks  has presented today for surgery, with the diagnosis of PRS HX COLON POLYPS  The various methods of treatment have been discussed with the patient and family. After consideration of risks, benefits and other options for treatment, the patient has consented to  Procedure(s): COLONOSCOPY WITH PROPOFOL (N/A) as a surgical intervention .  The patient's history has been reviewed, patient examined, no change in status, stable for surgery.  I have reviewed the patient's chart and labs.  Questions were answered to the patient's satisfaction.     [place], [place]

## 2018-07-23 NOTE — Anesthesia Post-op Follow-up Note (Signed)
Anesthesia QCDR form completed.        

## 2018-07-23 NOTE — H&P (Signed)
Outpatient short stay form Pre-procedure 07/23/2018 10:57 AM Ryan Hicks, M.D.  Primary Physician: Harrel Lemon, M.D.  Reason for visit: Personal hx of colon polyps.  History of present illness:  Patient presents for colonoscopy for  Personal hx of colon polyps.The patient denies complaints of abdominal pain, significant change in bowel habits, or rectal bleeding.     Current Facility-Administered Medications:  .  0.9 %  sodium chloride infusion, , Intravenous, Continuous, [place], Benay Pike, MD, Last Rate: 20 mL/hr at 07/23/18 1028  Medications Prior to Admission  Medication Sig Dispense Refill Last Dose  . aspirin EC 81 MG tablet Take by mouth.   Past Week at Unknown time  . atorvastatin (LIPITOR) 80 MG tablet TAKE 1 TABLET BY MOUTH EVERY NIGHT   Past Week at Unknown time  . benazepril (LOTENSIN) 40 MG tablet    Past Week at Unknown time  . cholecalciferol (DELTA D3) 400 units TABS tablet Take by mouth.   Past Week at Unknown time  . clobetasol (TEMOVATE) 0.05 % external solution   1 Past Week at Unknown time  . fluticasone (FLONASE) 50 MCG/ACT nasal spray Place into the nose.   Past Week at Unknown time  . glipiZIDE (GLUCOTROL) 10 MG tablet    Past Week at Unknown time  . glucosamine-chondroitin 500-400 MG tablet Take 1 tablet by mouth 3 (three) times daily.   Past Week at Unknown time  . HYDROcodone-acetaminophen (NORCO/VICODIN) 5-325 MG tablet Take 1 tablet by mouth every 6 (six) hours as needed for moderate pain.   Past Week at Unknown time  . JANUVIA 25 MG tablet    Past Week at Unknown time  . Magnesium 250 MG TABS Take by mouth.   Past Week at Unknown time  . metFORMIN (GLUCOPHAGE) 1000 MG tablet    Past Week at Unknown time  . Multiple Vitamin (MULTI-VITAMINS) TABS Take by mouth.   Past Week at Unknown time  . Omega-3 Fatty Acids (FISH OIL MAXIMUM STRENGTH) 1200 MG CPDR Take by mouth.   Past Week at Unknown time  . omeprazole (PRILOSEC) 20 MG capsule Take by mouth.    Past Week at Unknown time  . oxiconazole (OXISTAT) 1 % lotion    Past Week at Unknown time  . pimecrolimus (ELIDEL) 1 % cream    Past Week at Unknown time  . saw palmetto 160 MG capsule Take 160 mg by mouth 2 (two) times daily.   Past Week at Unknown time  . sildenafil (VIAGRA) 100 MG tablet Take by mouth.   Past Week at Unknown time  . sodium fluoride (DENTA 5000 PLUS) 1.1 % CREA dental cream    Past Week at Unknown time  . tamsulosin (FLOMAX) 0.4 MG CAPS capsule    Past Week at Unknown time  . vitamin A 8000 UNIT capsule Take by mouth.   Past Week at Unknown time  . vitamin B-12 (CYANOCOBALAMIN) 1000 MCG tablet Take by mouth.   Past Week at Unknown time  . vitamin C (ASCORBIC ACID) 500 MG tablet Take by mouth.   Past Week at Unknown time  . vitamin E 400 UNIT capsule Take 400 Units by mouth daily.   Past Week at Unknown time     Not on File   Past Medical History:  Diagnosis Date  . Diabetes mellitus without complication ([place])   . Enlarged prostate with lower urinary tract symptoms (LUTS)   . Heterozygous factor V Leiden mutation ([place])   . Hyperlipidemia   .  Hypertension   . Nephrolithiasis   . Pseudophakia of both eyes   . Stroke ([place])   . Testicular hypofunction   . Vertebral artery occlusion     Review of systems:  Otherwise negative.    Physical Exam  Gen: Alert, oriented. Appears stated age.  HEENT: [place]/AT. PERRLA. Lungs: CTA, no wheezes. CV: RR nl S1, S2. Abd: soft, benign, no masses. BS+ Ext: No edema. Pulses 2+    Planned procedures: Proceed with colonoscopy. The patient understands the nature of the planned procedure, indications, risks, alternatives and potential complications including but not limited to bleeding, infection, perforation, damage to internal organs and possible oversedation/side effects from anesthesia. The patient agrees and gives consent to proceed.  Please refer to procedure notes for findings, recommendations and patient  disposition/instructions.     Ryan Hicks, M.D. Gastroenterology 07/23/2018  10:57 AM

## 2018-07-23 NOTE — Transfer of Care (Signed)
Immediate Anesthesia Transfer of Care Note  Patient: Ryan Hicks  Procedure(s) Performed: COLONOSCOPY WITH PROPOFOL (N/A )  Patient Location: PACU  Anesthesia Type:General  Level of Consciousness: sedated  Airway & Oxygen Therapy: Patient Spontanous Breathing and Patient connected to nasal cannula oxygen  Post-op Assessment: Report given to RN and Post -op Vital signs reviewed and stable  Post vital signs: Reviewed and stable  Last Vitals:  Vitals Value Taken Time  BP 130/91 07/23/2018 11:35 AM  Temp 35.9 C 07/23/2018 11:33 AM  Pulse 74 07/23/2018 11:35 AM  Resp 18 07/23/2018 11:35 AM  SpO2 100 % 07/23/2018 11:35 AM    Last Pain:  Vitals:   07/23/18 1135  TempSrc: Tympanic  PainSc:       Patients Stated Pain Goal: 0 (17/83/75 4237)  Complications: No apparent anesthesia complications

## 2018-07-23 NOTE — Anesthesia Preprocedure Evaluation (Addendum)
Anesthesia Evaluation  Patient identified by MRN, date of birth, ID band Patient awake    Reviewed: Allergy & Precautions, H&P , NPO status , Patient's Chart, lab work & pertinent test results  Airway Mallampati: III  TM Distance: >3 FB Neck ROM: full   Comment: [place]  (+) Teeth Intact   Pulmonary neg pulmonary ROS,    breath sounds clear to auscultation       Cardiovascular hypertension,  Rhythm:regular Rate:Normal     Neuro/Psych H/o vertebral artery occlusion CVA negative psych ROS   GI/Hepatic negative GI ROS, Neg liver ROS,   Endo/Other  diabetes  Renal/GU Renal disease (stones)  negative genitourinary   Musculoskeletal   Abdominal   Peds  Hematology negative hematology ROS (+)   Anesthesia Other Findings Past Medical History: No date: Diabetes mellitus without complication ([facility]) No date: Enlarged prostate with lower urinary tract symptoms (LUTS) No date: Heterozygous factor V Leiden mutation ([facility]) No date: Hyperlipidemia No date: Hypertension No date: Nephrolithiasis No date: Pseudophakia of both eyes No date: Stroke ([place]) No date: Testicular hypofunction No date: Vertebral artery occlusion  Past Surgical History: No date: BACK SURGERY No date: DENTAL EXAMINATION UNDER ANESTHESIA No date: TONSILLECTOMY  BMI    Body Mass Index:  27.87 kg/m      Reproductive/Obstetrics negative OB ROS                            Anesthesia Physical Anesthesia Plan  ASA: III  Anesthesia Plan: General   Post-op Pain Management:    Induction:   PONV Risk Score and Plan: Propofol infusion and TIVA  Airway Management Planned: Natural Airway and Nasal Cannula  Additional Equipment:   Intra-op Plan:   Post-operative Plan:   Informed Consent: I have reviewed the patients History and Physical, chart, labs and discussed the procedure including the risks, benefits and  alternatives for the proposed anesthesia with the patient or authorized representative who has indicated his/her understanding and acceptance.   Dental Advisory Given  Plan Discussed with: Anesthesiologist, CRNA and Surgeon  Anesthesia Plan Comments:         Anesthesia Quick Evaluation

## 2018-07-25 ENCOUNTER — Encounter: Payer: Self-pay | Admitting: Internal Medicine

## 2018-10-01 ENCOUNTER — Ambulatory Visit
Admission: RE | Admit: 2018-10-01 | Discharge: 2018-10-01 | Disposition: A | Discharge status: Home / Self Care | Payer: Medicare PPO | Source: Ambulatory Visit | Attending: Internal Medicine | Admitting: Internal Medicine

## 2018-10-01 ENCOUNTER — Other Ambulatory Visit: Payer: Self-pay | Admitting: Internal Medicine

## 2018-10-01 DIAGNOSIS — I82442 Acute embolism and thrombosis of left tibial vein: Secondary | ICD-10-CM | POA: Insufficient documentation

## 2018-10-01 DIAGNOSIS — R52 Pain, unspecified: Secondary | ICD-10-CM

## 2018-10-01 DIAGNOSIS — M79662 Pain in left lower leg: Secondary | ICD-10-CM | POA: Diagnosis not present

## 2018-12-04 ENCOUNTER — Other Ambulatory Visit: Payer: Self-pay | Admitting: Cardiology

## 2018-12-04 DIAGNOSIS — I825Z9 Chronic embolism and thrombosis of unspecified deep veins of unspecified distal lower extremity: Secondary | ICD-10-CM | POA: Insufficient documentation

## 2018-12-10 ENCOUNTER — Other Ambulatory Visit: Payer: Self-pay | Admitting: Family Medicine

## 2018-12-10 ENCOUNTER — Ambulatory Visit: Payer: Medicare PPO

## 2018-12-10 MED ORDER — TAMSULOSIN HCL 0.4 MG PO CAPS: 0.4000 mg | ORAL_CAPSULE | Freq: Every day | ORAL | 11 refills | Status: DC

## 2018-12-16 ENCOUNTER — Ambulatory Visit
Admission: RE | Admit: 2018-12-16 | Discharge: 2018-12-16 | Disposition: A | Discharge status: Home / Self Care | Payer: Medicare PPO | Source: Ambulatory Visit | Attending: Cardiology | Admitting: Cardiology

## 2018-12-16 DIAGNOSIS — I825Z9 Chronic embolism and thrombosis of unspecified deep veins of unspecified distal lower extremity: Secondary | ICD-10-CM | POA: Diagnosis present

## 2018-12-16 MED ORDER — IOPAMIDOL (ISOVUE-370) INJECTION 76%
75.0000 mL | Freq: Once | INTRAVENOUS | Status: AC | PRN
  Administered 2018-12-16: 75 mL via INTRAVENOUS

## 2019-01-29 DIAGNOSIS — L739 Follicular disorder, unspecified: Secondary | ICD-10-CM

## 2019-01-29 DIAGNOSIS — L308 Other specified dermatitis: Secondary | ICD-10-CM

## 2019-01-29 DIAGNOSIS — L719 Rosacea, unspecified: Secondary | ICD-10-CM

## 2019-01-29 DIAGNOSIS — L309 Dermatitis, unspecified: Secondary | ICD-10-CM | POA: Insufficient documentation

## 2019-03-31 ENCOUNTER — Ambulatory Visit (INDEPENDENT_AMBULATORY_CARE_PROVIDER_SITE_OTHER): Payer: Medicare PPO | Admitting: Urology

## 2019-03-31 ENCOUNTER — Other Ambulatory Visit: Payer: Self-pay

## 2019-03-31 ENCOUNTER — Encounter: Payer: Self-pay | Admitting: Urology

## 2019-03-31 VITALS — BP 155/80 | HR 97 | Ht 72.0 in | Wt 243.0 lb

## 2019-03-31 DIAGNOSIS — N401 Enlarged prostate with lower urinary tract symptoms: Secondary | ICD-10-CM | POA: Diagnosis not present

## 2019-03-31 DIAGNOSIS — N5201 Erectile dysfunction due to arterial insufficiency: Secondary | ICD-10-CM

## 2019-03-31 MED ORDER — TADALAFIL 20 MG PO TABS: ORAL_TABLET | ORAL | 0 refills | Status: DC

## 2019-03-31 NOTE — Progress Notes (Signed)
03/31/2019 3:08 PM   Ryan Hicks Sep 21, 1951 [id number]  Referring provider: Baxter Hire, MD [place], [place] [address]  Chief Complaint  Patient presents with  . Follow-up   Urologic problem list: -BPH with LUTS symptoms; tamsulosin prn  HPI: 68 year old male followed for BPH.  He takes tamsulosin as needed which has worked well.  He presently states he is voiding with an excellent stream.  Denies dysuria or gross hematuria.  Denies flank, abdominal, pelvic or scrotal pain.  He does have a history of ED and recently has had difficulty achieving an erection.  He has partial erections making penetration difficult.  He has used Viagra in the past which has been effective.  Denies use of oral or sublingual nitrates.   PMH: Past Medical History:  Diagnosis Date  . Diabetes mellitus without complication ([place])   . Enlarged prostate with lower urinary tract symptoms (LUTS)   . Heterozygous factor V Leiden mutation ([place])   . History of dysplastic nevus 08/20/2017   spinal upper back  . Hyperlipidemia   . Nephrolithiasis   . Pseudophakia of both eyes   . Stroke ([place])   . Testicular hypofunction   . Vertebral artery occlusion     Surgical History: Past Surgical History:  Procedure Laterality Date  . BACK SURGERY    . COLONOSCOPY WITH PROPOFOL N/A 07/23/2018   Procedure: COLONOSCOPY WITH PROPOFOL;  Surgeon: Toledo, Benay Pike, MD;  Location: [facility] ENDOSCOPY;  Service: Gastroenterology;  Laterality: N/A;  . DENTAL EXAMINATION UNDER ANESTHESIA    . TONSILLECTOMY      Home Medications:  Allergies as of 03/31/2019   No Known Allergies     Medication List       Accurate as of March 31, 2019  3:08 PM. If you have any questions, ask your nurse or doctor.        STOP taking these medications   atorvastatin 80 MG tablet Commonly known as:  LIPITOR Stopped by:  Abbie Sons, MD   Viagra 100 MG tablet Generic drug:  sildenafil Stopped by:  Abbie Sons, MD     TAKE these medications   aspirin EC 81 MG tablet Take by mouth.   benazepril 40 MG tablet Commonly known as:  LOTENSIN   clindamycin 1 % lotion Commonly known as:  CLEOCIN T Apply topically daily.   clobetasol 0.05 % external solution Commonly known as:  TEMOVATE   Delta D3 10 MCG (400 UNIT) Tabs tablet Generic drug:  cholecalciferol Take by mouth.   Denta 5000 Plus 1.1 % Crea dental cream Generic drug:  sodium fluoride   Elidel 1 % cream Generic drug:  pimecrolimus   Eliquis 5 MG Tabs tablet Generic drug:  apixaban TAKE 2 TABLETS BY MOUTH TWO TIMES A DAY FOR 7 DAYS; THEN TAKE 1 TABLET TWO TIMES A DAY   Fish Oil Maximum Strength 1200 MG Cpdr Take by mouth.   fluticasone 50 MCG/ACT nasal spray Commonly known as:  FLONASE Place into the nose.   glipiZIDE 10 MG tablet Commonly known as:  GLUCOTROL   glucosamine-chondroitin 500-400 MG tablet Take 1 tablet by mouth 3 (three) times daily.   HYDROcodone-acetaminophen 5-325 MG tablet Commonly known as:  NORCO/VICODIN Take 1 tablet by mouth every 6 (six) hours as needed for moderate pain.   Januvia 25 MG tablet Generic drug:  sitaGLIPtin   Magnesium 250 MG Tabs Take by mouth.   metFORMIN 1000 MG tablet Commonly known as:  GLUCOPHAGE   metoprolol succinate 25 MG 24 hr tablet Commonly known as:  TOPROL-XL Take by mouth.   metroNIDAZOLE 1 % gel Commonly known as:  METROGEL Apply topically daily.   Multi-Vitamins Tabs Take by mouth.   omeprazole 20 MG capsule Commonly known as:  PRILOSEC Take by mouth.   Oxistat 1 % lotion Generic drug:  oxiconazole   saw palmetto 160 MG capsule Take 160 mg by mouth 2 (two) times daily.   tamsulosin 0.4 MG Caps capsule Commonly known as:  FLOMAX Take 1 capsule (0.4 mg total) by mouth daily.   vitamin A 8000 UNIT capsule Take by mouth.   vitamin B-12 1000 MCG tablet Commonly known as:  CYANOCOBALAMIN Take by mouth.   vitamin C 500 MG tablet  Commonly known as:  ASCORBIC ACID Take by mouth.   vitamin E 400 UNIT capsule Take 400 Units by mouth daily.       Allergies: No Known Allergies  Family History: No family history on file.  Social History:  reports that he has never smoked. He has never used smokeless tobacco. He reports that he does not drink alcohol or use drugs.  ROS: UROLOGY Frequent Urination?: No Hard to postpone urination?: No Burning/pain with urination?: No Get up at night to urinate?: No Leakage of urine?: No Urine stream starts and stops?: No Trouble starting stream?: No Do you have to strain to urinate?: No Blood in urine?: No Urinary tract infection?: No Sexually transmitted disease?: No Injury to kidneys or bladder?: No Painful intercourse?: No Weak stream?: No Erection problems?: No Penile pain?: No  Gastrointestinal Nausea?: No Vomiting?: No Indigestion/heartburn?: No Diarrhea?: No Constipation?: No  Constitutional Fever: No Night sweats?: No Weight loss?: No Fatigue?: No  Skin Skin rash/lesions?: No Itching?: No  Eyes Blurred vision?: No Double vision?: No  Ears/Nose/Throat Sore throat?: No Sinus problems?: No  Hematologic/Lymphatic Swollen glands?: No Easy bruising?: No  Cardiovascular Leg swelling?: No Chest pain?: No  Respiratory Cough?: No Shortness of breath?: No  Endocrine Excessive thirst?: No  Musculoskeletal Back pain?: No Joint pain?: No  Neurological Headaches?: No Dizziness?: No  Psychologic Depression?: No Anxiety?: No  Physical Exam: BP (!) 155/80 (BP Location: Left Arm, Patient Position: Sitting, Cuff Size: Normal)   Pulse 97   Ht 6' (1.829 m)   Wt 243 lb (110.2 kg)   BMI 32.96 kg/m   Constitutional:  Alert and oriented, No acute distress. HEENT: [place] AT, moist mucus membranes.  Trachea midline, no masses. Cardiovascular: No clubbing, cyanosis, or edema. Respiratory: Normal respiratory effort, no increased work of  breathing. GU: No CVA tenderness.  Prostate 50 g, smooth without nodules Skin: No rashes, bruises or suspicious lesions. Neurologic: Grossly intact, no focal deficits, moving all 4 extremities. Psychiatric: Normal mood and affect.   Assessment & Plan:   68 year old male with BPH and mild lower urinary tract symptoms which are stable.  He will continue tamsulosin as needed.  He did not need a refill.  He also has ED and sildenafil has been effective in the past.  He was interested in a trial of generic tadalafil and Rx was sent to his pharmacy.  Potential side effects were discussed.  If this medication is not effective other treatments were discussed including vacuum erection devices and intracavernosal injections.  Return in about 1 year (around 03/30/2020) for Recheck, PSA.   Abbie Sons, [facility] [address], [place], [place] [address] [phone]

## 2019-04-01 LAB — PSA: Prostate Specific Ag, Serum: 0.8 ng/mL (ref 0.0–4.0)

## 2019-04-02 ENCOUNTER — Telehealth: Payer: Self-pay | Admitting: Urology

## 2019-04-02 ENCOUNTER — Telehealth: Payer: Self-pay

## 2019-04-02 NOTE — Telephone Encounter (Signed)
Left pt mess to call 

## 2019-04-02 NOTE — Telephone Encounter (Signed)
-----   Message from Abbie Sons, MD sent at 04/02/2019 12:41 PM EDT ----- PSA stable at 0.8.  Continue annual follow-up

## 2019-04-03 NOTE — Telephone Encounter (Signed)
Patient notified of results.

## 2019-12-16 ENCOUNTER — Other Ambulatory Visit: Payer: Self-pay | Admitting: Urology

## 2020-03-16 ENCOUNTER — Other Ambulatory Visit: Payer: Self-pay | Admitting: Family Medicine

## 2020-03-16 DIAGNOSIS — N401 Enlarged prostate with lower urinary tract symptoms: Secondary | ICD-10-CM

## 2020-03-22 ENCOUNTER — Other Ambulatory Visit: Payer: Medicare PPO

## 2020-03-22 ENCOUNTER — Encounter: Payer: Self-pay | Admitting: Urology

## 2020-03-30 ENCOUNTER — Ambulatory Visit: Payer: Medicare PPO | Admitting: Urology

## 2020-03-31 ENCOUNTER — Encounter (INDEPENDENT_AMBULATORY_CARE_PROVIDER_SITE_OTHER): Payer: Self-pay

## 2020-03-31 ENCOUNTER — Encounter: Payer: Self-pay | Admitting: Urology

## 2020-03-31 ENCOUNTER — Other Ambulatory Visit: Payer: Self-pay

## 2020-03-31 ENCOUNTER — Ambulatory Visit (INDEPENDENT_AMBULATORY_CARE_PROVIDER_SITE_OTHER): Payer: Medicare PPO | Admitting: Urology

## 2020-03-31 VITALS — BP 154/83 | HR 76 | Ht 73.0 in | Wt 240.0 lb

## 2020-03-31 DIAGNOSIS — N401 Enlarged prostate with lower urinary tract symptoms: Secondary | ICD-10-CM

## 2020-03-31 NOTE — Progress Notes (Signed)
03/30/20 1:27 PM   Ryan Hicks 1950/12/01 [id number]  Referring provider: Baxter Hire, MD [place],  [place] [address] Chief Complaint  Patient presents with  . Benign Prostatic Hypertrophy   Urologic problem list: -BPH with LUTS symptoms;tamsulosin prn  HPI: Ryan Hicks is a 69 y.o. male who presents today for an annual follow up.  -Denies change in urinary symptoms, hematuria -Complains of ED; previously on PD-5 inhibitors which has not effective   PMH: Past Medical History:  Diagnosis Date  . Diabetes mellitus without complication ([place])   . Enlarged prostate with lower urinary tract symptoms (LUTS)   . Heterozygous factor V Leiden mutation ([place])   . History of dysplastic nevus 08/20/2017   spinal upper back  . Hyperlipidemia   . Nephrolithiasis   . Pseudophakia of both eyes   . Stroke ([place])   . Testicular hypofunction   . Vertebral artery occlusion     Surgical History: Past Surgical History:  Procedure Laterality Date  . BACK SURGERY    . COLONOSCOPY WITH PROPOFOL N/A 07/23/2018   Procedure: COLONOSCOPY WITH PROPOFOL;  Surgeon: Toledo, Benay Pike, MD;  Location: [facility] ENDOSCOPY;  Service: Gastroenterology;  Laterality: N/A;  . DENTAL EXAMINATION UNDER ANESTHESIA    . TONSILLECTOMY      Home Medications:  Allergies as of 03/31/2020   No Known Allergies     Medication List       Accurate as of March 31, 2020  1:27 PM. If you have any questions, ask your nurse or doctor.        STOP taking these medications   benazepril 40 MG tablet Commonly known as: LOTENSIN Stopped by: Abbie Sons, MD   clindamycin 1 % lotion Commonly known as: CLEOCIN T Stopped by: Abbie Sons, MD   Delta D3 10 MCG (400 UNIT) Tabs tablet Generic drug: cholecalciferol Stopped by: Abbie Sons, MD   Eliquis 5 MG Tabs tablet Generic drug: apixaban Stopped by: Abbie Sons, MD   Fish Oil Maximum Strength 1200 MG Cpdr Stopped by: Abbie Sons, MD   fluticasone 50 MCG/ACT nasal spray Commonly known as: FLONASE Stopped by: Abbie Sons, MD   HYDROcodone-acetaminophen 5-325 MG tablet Commonly known as: NORCO/VICODIN Stopped by: Abbie Sons, MD   Multi-Vitamins Tabs Stopped by: Abbie Sons, MD     TAKE these medications   amLODipine 2.5 MG tablet Commonly known as: NORVASC Take by mouth.   aspirin EC 81 MG tablet Take by mouth.   atorvastatin 80 MG tablet Commonly known as: LIPITOR Take by mouth.   clobetasol 0.05 % external solution Commonly known as: TEMOVATE   Denta 5000 Plus 1.1 % Crea dental cream Generic drug: sodium fluoride   Elidel 1 % cream Generic drug: pimecrolimus   erythromycin ophthalmic ointment   glipiZIDE 10 MG tablet Commonly known as: GLUCOTROL   glucosamine-chondroitin 500-400 MG tablet Take 1 tablet by mouth 3 (three) times daily.   Januvia 25 MG tablet Generic drug: sitaGLIPtin   Magnesium 250 MG Tabs Take by mouth.   metFORMIN 1000 MG tablet Commonly known as: GLUCOPHAGE   metoprolol succinate 25 MG 24 hr tablet Commonly known as: TOPROL-XL Take by mouth.   metroNIDAZOLE 1 % gel Commonly known as: METROGEL Apply topically daily.   omeprazole 20 MG capsule Commonly known as: PRILOSEC Take by mouth.   Oxistat 1 % lotion Generic drug: oxiconazole   Precision QID Test test strip  Generic drug: glucose blood 1 each (1 strip total) by XX route 2 (two) times daily for 30 days Use as instructed.   saw palmetto 160 MG capsule Take 160 mg by mouth 2 (two) times daily.   tadalafil 20 MG tablet Commonly known as: CIALIS 1 tablet 60 minutes prior to intercourse   tamsulosin 0.4 MG Caps capsule Commonly known as: FLOMAX TAKE ONE CAPSULE BY MOUTH DAILY   vitamin A 8000 UNIT capsule Take by mouth.   vitamin B-12 1000 MCG tablet Commonly known as: CYANOCOBALAMIN Take by mouth.   vitamin C 500 MG tablet Commonly known as: ASCORBIC ACID Take by  mouth.   vitamin E 180 MG (400 UNITS) capsule Take 400 Units by mouth daily.       Allergies: No Known Allergies  Family History: History reviewed. No pertinent family history.  Social History:  reports that he has never smoked. He has never used smokeless tobacco. He reports that he does not drink alcohol or use drugs.   Physical Exam: BP (!) 154/83   Pulse 76   Ht 6\' 1"  (1.854 m)   Wt 240 lb (108.9 kg)   BMI 31.66 kg/m   Constitutional:  Alert and oriented, No acute distress. HEENT: [place] AT, moist mucus membranes.  Trachea midline, no masses. Cardiovascular: No clubbing, cyanosis, or edema. Respiratory: Normal respiratory effort, no increased work of breathing. GU: No CVA tenderness. Prostate 50 grams, smooth without nodules Skin: No rashes, bruises or suspicious lesions. Neurologic: Grossly intact, no focal deficits, moving all 4 extremities. Psychiatric: Normal mood and affect.   Assessment & Plan:    1. BPH with mild LUTS -Stable -Desires to continue prostate cancer screening and his PSA was drawn today. -Tamsulosin as needed -Continue annual follow up  2. Erectile Dysfunction -Persistent -Failed PD-5 inhibitors -Discussed vacuum erection devices, pharmacologic erections, and penile implant surgery. He was most interested in penile injections and was given a brochure and will call back if interested in pursuing.   [facility] [address], [place], [place] [address] [phone]  I, Joneen Boers Peace, am acting as a Education administrator for Dr. Nicki Reaper C. Glynda Soliday.  I have reviewed the above documentation for accuracy and completeness, and I agree with the above.   Abbie Sons, MD

## 2020-04-01 ENCOUNTER — Encounter: Payer: Self-pay | Admitting: Urology

## 2020-04-01 LAB — PSA: Prostate Specific Ag, Serum: 1.1 ng/mL (ref 0.0–4.0)

## 2020-04-02 ENCOUNTER — Telehealth: Payer: Self-pay | Admitting: *Deleted

## 2020-04-02 NOTE — Telephone Encounter (Signed)
Notified patient as instructed,.  

## 2020-04-02 NOTE — Telephone Encounter (Signed)
-----   Message from Abbie Sons, MD sent at 04/02/2020  9:12 AM EDT ----- PSA stable at 1.1.  Continue annual follow-up

## 2020-04-13 ENCOUNTER — Ambulatory Visit (INDEPENDENT_AMBULATORY_CARE_PROVIDER_SITE_OTHER): Payer: Medicare PPO | Admitting: Dermatology

## 2020-04-13 ENCOUNTER — Other Ambulatory Visit: Payer: Self-pay

## 2020-04-13 DIAGNOSIS — L219 Seborrheic dermatitis, unspecified: Secondary | ICD-10-CM

## 2020-04-13 MED ORDER — KETOCONAZOLE 2 % EX CREA: 1.0000 "application " | TOPICAL_CREAM | Freq: Two times a day (BID) | CUTANEOUS | 1 refills | Status: AC

## 2020-04-13 MED ORDER — HYDROCORTISONE 2.5 % EX CREA: TOPICAL_CREAM | Freq: Two times a day (BID) | CUTANEOUS | 1 refills | Status: DC | PRN

## 2020-04-13 MED ORDER — KETOCONAZOLE 2 % EX SHAM: MEDICATED_SHAMPOO | CUTANEOUS | 1 refills | Status: DC

## 2020-04-13 NOTE — Progress Notes (Signed)
° °  Follow-Up Visit   Subjective  Ryan Hicks is a 69 y.o. male who presents for the following: Skin Problem.  Patient advises that about 5-6 weeks ago he noticed a red patch that itched at the lower right side of chin. He has used over the counter and prescription topical antibiotics which seemed to help. No draining/crusting, no pus.  Seems to get worse the longer he goes without shaving.  The following portions of the chart were reviewed this encounter and updated as appropriate:      Review of Systems:  No other skin or systemic complaints except as noted in HPI or Assessment and Plan.  Objective  Well appearing patient in no apparent distress; mood and affect are within normal limits.  A focused examination was performed including face. Relevant physical exam findings are noted in the Assessment and Plan.  Objective  Right Inferior Chin: Light pink patch- no scale today   Assessment & Plan  Seborrheic dermatitis Right Inferior Chin  Start ketoconazole 2% shampoo to wash beard area 2-3 times a week PRN, leaving on for a few minutes before rinsing.  Start HC 2.5% cream with ketoconazole 2% cream twice a day until improved then decrease to once a day. Once clear continue ketoconazole only as needed. Instructions given to patient.   Ordered Medications: ketoconazole (NIZORAL) 2 % shampoo ketoconazole (NIZORAL) 2 % cream hydrocortisone 2.5 % cream  Return if symptoms worsen or fail to improve.  Graciella Belton, RMA, am acting as scribe for Brendolyn Patty, MD .  Documentation: I have reviewed the above documentation for accuracy and completeness, and I agree with the above.  Brendolyn Patty MD

## 2020-04-13 NOTE — Patient Instructions (Addendum)
Recommend daily broad spectrum sunscreen SPF 30+ to sun-exposed areas, reapply every 2 hours as needed. Call for new or changing lesions.  Seborrheic Dermatitis  What is seborrheic dermatitis? Seborrheic (say: seb-oh-ree-ick) dermatitis is a disease that causes flaking of the skin.  It usually affects the scalp.  In teenagers and adults, it is commonly called "dandruff".  In infants, it is referred to as "cradle cap".  Dandruff often appears as scaling on the scalp with or without redness.  On other parts of the body, seborrheic dermatitis tends to produce both redness and scaling.  Other common locations of seborrheic dermatitis include the central face, eyebrows, chest, and the creases of the arms, legs, and groin.  It often causes the skin to look a little greasy, scaly, or flaky. Seborrheic dermatitis can occur at any age.  It often comes and goes and may to be seasonally related, especially in the Northern climates.  What causes seborrheic dermatitis? The exact cause is not known, though yeast of the Malassezia species may be involved.  This organism is normally present on the skin in small numbers, but sometimes its numbers increase, especially in oily skin.  Treatments that reduce the yeast tend to improve seborrheic dermatitis.  How is seborrheic dermatitis treated? The treatment of seborrheic dermatitis depends on its location on the body and the person's age. Seborrheic dermatitis of the scalp (dandruff) in adults and teenagers is usually treated with a medicated shampoo.  Here is a list of the medications that help, and the over-counter shampoos that contain them:  Salicylic acid (Neutrogena T/Sal, Sebulex, Scalpicin, Denorex Extra Strength)  Zinc pyrithione (Head & Shoulders white bottle, Denorex Daily, DHS Zinc, Pantene Pro-V Pyrithione Zinc)  Selenium sulfide (Head & Shoulders blue bottle, Selsun Blue, Exsel Lotion Shampoo, Glo-Sel)  Yahoo tar (Neutrogena T/Gal, Pentrax, Zetar,  Tegrin, Viacom, Therapeutic Denorex)  Ketoconazole (Nizoral)  If you have dandruff, you might start by using one of these shampoos every day until your dandruff is controlled and then keep using it at least twice a week.  Often times your doctor will recommend a rotation of several different medicated shampoos as some will experience a plateau in the effectiveness of any one shampoo.   When you use a dandruff shampoo, rub the shampoo into your wet hair and massage into scalp thoroughly.  Let it stay on your hair and scalp for 5 minutes before rinsing.  If you have involvement in the eyebrows or face, you can lather those areas with the medicated shampoo as well, or use a medicated soap (ZNP-bar, Polytar Soap, SAStid, or sulfur soap).    If the wash or shampoo alone does not help, your doctor might want you to use a prescription medication once or twice a day.  Leave-in medications for the scalp are best applied by massaging into the scalp immediately after towel drying your hair, but may be applied even if you have not washed your hair.  Seborrheic dermatitis in infants usually clears up by age 53 -69 months.  It may develop in the diaper area where it might be confused with diaper rash.  For milder cases you can try gently brushing out scales with a soft brush.  This is best done immediately after washing with a non-medicated baby shampoo Wynetta Emery and Royce Macadamia, etc.).  Your doctor may recommend a medicated shampoo or a prescription topical medication.   Seborrheic Dermatitis  Mix hydrocortisone with ketaconazole 2% twice a day. If improved, decrease to hydrocortisone and  ketaconazole mixed once a day. If still clear, decrease to ketaconazole only.

## 2020-09-07 ENCOUNTER — Other Ambulatory Visit: Payer: Self-pay

## 2020-09-07 ENCOUNTER — Ambulatory Visit (INDEPENDENT_AMBULATORY_CARE_PROVIDER_SITE_OTHER): Payer: Medicare PPO | Admitting: Dermatology

## 2020-09-07 DIAGNOSIS — L814 Other melanin hyperpigmentation: Secondary | ICD-10-CM

## 2020-09-07 DIAGNOSIS — L821 Other seborrheic keratosis: Secondary | ICD-10-CM

## 2020-09-07 DIAGNOSIS — L219 Seborrheic dermatitis, unspecified: Secondary | ICD-10-CM | POA: Diagnosis not present

## 2020-09-07 DIAGNOSIS — L304 Erythema intertrigo: Secondary | ICD-10-CM

## 2020-09-07 DIAGNOSIS — Z86018 Personal history of other benign neoplasm: Secondary | ICD-10-CM

## 2020-09-07 DIAGNOSIS — D229 Melanocytic nevi, unspecified: Secondary | ICD-10-CM

## 2020-09-07 DIAGNOSIS — L578 Other skin changes due to chronic exposure to nonionizing radiation: Secondary | ICD-10-CM

## 2020-09-07 DIAGNOSIS — Z1283 Encounter for screening for malignant neoplasm of skin: Secondary | ICD-10-CM

## 2020-09-07 DIAGNOSIS — L739 Follicular disorder, unspecified: Secondary | ICD-10-CM | POA: Diagnosis not present

## 2020-09-07 DIAGNOSIS — L820 Inflamed seborrheic keratosis: Secondary | ICD-10-CM

## 2020-09-07 DIAGNOSIS — D1800 Hemangioma unspecified site: Secondary | ICD-10-CM

## 2020-09-07 DIAGNOSIS — L905 Scar conditions and fibrosis of skin: Secondary | ICD-10-CM

## 2020-09-07 MED ORDER — OXICONAZOLE NITRATE 1 % EX CREA: TOPICAL_CREAM | CUTANEOUS | 2 refills | Status: DC

## 2020-09-07 MED ORDER — PIMECROLIMUS 1 % EX CREA: TOPICAL_CREAM | CUTANEOUS | 2 refills | Status: DC

## 2020-09-07 MED ORDER — DOXYCYCLINE HYCLATE 100 MG PO CAPS: 100.0000 mg | ORAL_CAPSULE | Freq: Every day | ORAL | 0 refills | Status: AC

## 2020-09-07 MED ORDER — SELENIUM SULFIDE 2.5 % EX LOTN: TOPICAL_LOTION | CUTANEOUS | 2 refills | Status: DC

## 2020-09-07 MED ORDER — PANOXYL CREAMY WASH 4 % EX LIQD: CUTANEOUS | 12 refills | Status: DC

## 2020-09-07 NOTE — Progress Notes (Signed)
Follow-Up Visit   Subjective  Ryan Hicks is a 69 y.o. male who presents for the following: Annual Exam (TBSE today. Hx of mild dysplastic nevus. Hx TV. ). He uses Selenium sulfide lotion for TV.  Also has folliculitis, worse after working out. Tends to pick at bumps.  Also h/o intertrigo, but has improved with weight loss. Uses Elidel cream and Oxistat lotion as needed.  He recently had bad fall and scarred knees.  He also uses CeraVe/clobetasol mix to body prn itchy bumps.  He also gets seb derm in his beard area.  No new or changing spots except one on back is really itchy.    The following portions of the chart were reviewed this encounter and updated as appropriate:     Review of Systems: No other skin or systemic complaints except as noted in HPI or Assessment and Plan.   Objective  Well appearing patient in no apparent distress; mood and affect are within normal limits.  A full examination was performed including scalp, head, eyes, ears, nose, lips, neck, chest, axillae, abdomen, back, buttocks, bilateral upper extremities, bilateral lower extremities, hands, feet, fingers, toes, fingernails, and toenails. All findings within normal limits unless otherwise noted below.  Objective  right spinal lower back x 1: Erythematous keratotic or waxy stuck-on papule   Objective  right neck, right shoulder, right upper arm, left shoulder: Perifollicular erythematous papules and pustules   Objective  bilateral knees: Violaceous patches  Objective  Body folds: Clear today  Objective  right chin: Mild erythema right chin  Healing excoriations on scalp   Assessment & Plan    Inflamed seborrheic keratosis right spinal lower back x 1  Prior to procedure, discussed risks of blister formation, small wound, skin dyspigmentation, or rare scar following cryotherapy.    Destruction of lesion - right spinal lower back x 1  Destruction method: cryotherapy   Informed consent:  discussed and consent obtained   Lesion destroyed using liquid nitrogen: Yes   Region frozen until ice ball extended beyond lesion: Yes   Outcome: patient tolerated procedure well with no complications   Post-procedure details: wound care instructions given    Folliculitis right neck, right shoulder, right upper arm, left shoulder  Chronic condition, currently flared Continue Panoxyl 4% wash to affected areas. Left sit for 1-2 minutes before rinsing.   Continue Doxycycline 100 mg po BID prn flares. #60 1RF Doxycycline should be taken with food to prevent nausea. Do not lay down for 30 minutes after taking. Be cautious with sun exposure and use good sun protection while on this medication. Pregnant women should not take this medication.    Continue Selenium Sulfide 2.5% solution to aas body prn.  Continue Clobetasol solution mixed with CereVe cream prn for itch. Pt has at home.   Ordered Medications: doxycycline (VIBRAMYCIN) 100 MG capsule benzoyl peroxide (PANOXYL CREAMY WASH) 4 % external liquid  Scar bilateral knees  Benign, observe.    Erythema intertrigo Body folds  Chronic, controlled Continue Elidel. Apply to qd/bid body folds PRN. Continue Oxistat lotion. Apply to qd/bid body folds PRN.   Ordered Medications: pimecrolimus (ELIDEL) 1 % cream oxiconazole (OXISTAT) 1 % CREA  Seborrheic dermatitis right chin  Chronic, controlled Continue Elidel cream or HC 2.5% cream qd/bid PRN flares.  Continue H&S shampoo qd or 2% Ketoconazole shampoo prn flares  Other Related Medications ketoconazole (NIZORAL) 2 % shampoo hydrocortisone 2.5 % cream   History of Dysplastic Nevi Spinal upper back (2018) -  No evidence of recurrence today - Recommend regular full body skin exams - Recommend daily broad spectrum sunscreen SPF 30+ to sun-exposed areas, reapply every 2 hours as needed.  - Call if any new or changing lesions are noted between office visits   Lentigines -  Scattered tan macules - Discussed due to sun exposure - Benign, observe - Call for any changes  Seborrheic Keratoses - Stuck-on, waxy, tan-brown papules and plaques  - Discussed benign etiology and prognosis. - Observe - Call for any changes  Melanocytic Nevi - Tan-brown and/or pink-flesh-colored symmetric macules and papules - Benign appearing on exam today - Observation - Call clinic for new or changing moles - Recommend daily use of broad spectrum spf 30+ sunscreen to sun-exposed areas.   Hemangiomas - Red papules - Discussed benign nature - Observe - Call for any changes  Actinic Damage - Chronic, secondary to cumulative UV/sun exposure - diffuse scaly erythematous macules with underlying dyspigmentation - Recommend daily broad spectrum sunscreen SPF 30+ to sun-exposed areas, reapply every 2 hours as needed.  - Call for new or changing lesions.  Skin cancer screening performed today.    Return in about 1 year (around 09/07/2021) for TBSE.   I, Harriett Sine, CMA, am acting as scribe for Brendolyn Patty, MD.  Documentation: I have reviewed the above documentation for accuracy and completeness, and I agree with the above.  Brendolyn Patty MD

## 2020-09-07 NOTE — Addendum Note (Signed)
Addended by: Harriett Sine on: 09/07/2020 11:09 AM   Modules accepted: Orders

## 2021-01-15 ENCOUNTER — Other Ambulatory Visit: Payer: Self-pay | Admitting: Urology

## 2021-03-31 ENCOUNTER — Ambulatory Visit: Payer: Self-pay | Admitting: Urology

## 2021-04-13 ENCOUNTER — Other Ambulatory Visit: Payer: Self-pay

## 2021-04-13 ENCOUNTER — Ambulatory Visit: Payer: Self-pay | Admitting: Urology

## 2021-04-13 ENCOUNTER — Encounter: Payer: Self-pay | Admitting: Urology

## 2021-04-13 ENCOUNTER — Ambulatory Visit (INDEPENDENT_AMBULATORY_CARE_PROVIDER_SITE_OTHER): Payer: Medicare PPO | Admitting: Urology

## 2021-04-13 VITALS — BP 182/84 | HR 85 | Ht 73.0 in | Wt 218.5 lb

## 2021-04-13 DIAGNOSIS — N401 Enlarged prostate with lower urinary tract symptoms: Secondary | ICD-10-CM

## 2021-04-13 DIAGNOSIS — N5201 Erectile dysfunction due to arterial insufficiency: Secondary | ICD-10-CM | POA: Diagnosis not present

## 2021-04-13 LAB — BLADDER SCAN AMB NON-IMAGING: Scan Result: 24

## 2021-04-13 MED ORDER — TAMSULOSIN HCL 0.4 MG PO CAPS: 0.4000 mg | ORAL_CAPSULE | Freq: Every day | ORAL | 3 refills | Status: DC

## 2021-04-13 NOTE — Progress Notes (Signed)
04/13/2021 10:38 AM   Ryan Hicks Dwyane Dee 1951-04-12 [id number]  Referring provider: Baxter Hire, MD [place],  [place] [address]  Chief Complaint  Patient presents with   Benign Prostatic Hypertrophy     Urologic problem list: BPH with LUTS symptoms tamsulosin prn  2.  Erectile dysfunction Failed PDE 5 inhibitors  HPI: 70 y.o. male presents for annual follow-up.  No significant problems since last years visit Stable LUTS and taking tamsulosin daily Denies dysuria, gross hematuria Denies flank, abdominal or pelvic pain PSA with PCP 02/2021 stable 0.73 We discussed second line options for his ED last year and has not desired to pursue   PMH: Past Medical History:  Diagnosis Date   Diabetes mellitus without complication ([place])    Enlarged prostate with lower urinary tract symptoms (LUTS)    Heterozygous factor V Leiden mutation ([place])    History of dysplastic nevus 08/20/2017   spinal upper back, mild atypia, limited margins free   Hyperlipidemia    Nephrolithiasis    Pseudophakia of both eyes    Stroke [facility])    Testicular hypofunction    Vertebral artery occlusion     Surgical History: Past Surgical History:  Procedure Laterality Date   BACK SURGERY     COLONOSCOPY WITH PROPOFOL N/A 07/23/2018   Procedure: COLONOSCOPY WITH PROPOFOL;  Surgeon: Toledo, Benay Pike, MD;  Location: [facility] ENDOSCOPY;  Service: Gastroenterology;  Laterality: N/A;   DENTAL EXAMINATION UNDER ANESTHESIA     TONSILLECTOMY      Home Medications:  Allergies as of 04/13/2021   No Known Allergies      Medication List        Accurate as of April 13, 2021 10:38 AM. If you have any questions, ask your nurse or doctor.          amLODipine 2.5 MG tablet Commonly known as: NORVASC Take by mouth.   aspirin EC 81 MG tablet Take by mouth.   atorvastatin 80 MG tablet Commonly known as: LIPITOR Take by mouth.   clobetasol 0.05 % external solution Commonly known  as: TEMOVATE   erythromycin ophthalmic ointment   glipiZIDE 10 MG tablet Commonly known as: GLUCOTROL   glucosamine-chondroitin 500-400 MG tablet Take 1 tablet by mouth 3 (three) times daily.   hydrocortisone 2.5 % cream Apply topically 2 (two) times daily as needed (Rash).   Januvia 25 MG tablet Generic drug: sitaGLIPtin   ketoconazole 2 % shampoo Commonly known as: NIZORAL Use to wash beard 2-3 times weekly, letting sit a few minutes before rinsing.   Magnesium 250 MG Tabs Take by mouth.   metFORMIN 1000 MG tablet Commonly known as: GLUCOPHAGE   metoprolol succinate 25 MG 24 hr tablet Commonly known as: TOPROL-XL Take by mouth.   metroNIDAZOLE 1 % gel Commonly known as: METROGEL Apply topically daily.   oxiconazole 1 % Crea Commonly known as: OXISTAT Apply to affected areas as directed as needed   PanOxyl Creamy Wash 4 % external liquid Generic drug: benzoyl peroxide Apply to affected areas in shower daily as directed. Let sit for 1-2 minutes before rinsing.   pimecrolimus 1 % cream Commonly known as: Elidel Apply to affected areas as directed as needed.   selenium sulfide 2.5 % shampoo Commonly known as: SELSUN Apply as directed to affected areas as needed.   Semaglutide(0.25 or 0.5MG /DOS) 2 MG/1.5ML Sopn Inject into the skin.   sodium fluoride 1.1 % Crea dental cream Commonly known as: PREVIDENT 5000 PLUS  tamsulosin 0.4 MG Caps capsule Commonly known as: FLOMAX TAKE ONE CAPSULE BY MOUTH DAILY        Allergies: No Known Allergies  Family History: History reviewed. No pertinent family history.  Social History:  reports that he has never smoked. He has never used smokeless tobacco. He reports that he does not drink alcohol and does not use drugs.   Physical Exam: BP (!) 182/84   Pulse 85   Ht 6\' 1"  (1.854 m)   Wt 218 lb 8 oz (99.1 kg)   BMI 28.83 kg/m   Constitutional:  Alert and oriented, No acute distress. HEENT: [place] AT, moist mucus  membranes.  Trachea midline, no masses. Cardiovascular: No clubbing, cyanosis, or edema. Respiratory: Normal respiratory effort, no increased work of breathing. GU: Prostate 50 g, smooth without nodules Skin: No rashes, bruises or suspicious lesions. Neurologic: Grossly intact, no focal deficits, moving all 4 extremities. Psychiatric: Normal mood and affect.   Assessment & Plan:    1. Benign prostatic hyperplasia with LUTS  UA today unremarkable Bladder scan PVR 24 mL Tamsulosin refilled Continue annual follow-up  2.  Erectile dysfunction Failed PDE 5 inhibitor therapy Has not desired to pursue second line options   Abbie Sons, MD  [facility] [address], [place], [place] [address] [phone]

## 2021-04-15 LAB — URINALYSIS, COMPLETE
Bilirubin, UA: NEGATIVE
Ketones, UA: NEGATIVE
Leukocytes,UA: NEGATIVE
Nitrite, UA: NEGATIVE
Specific Gravity, UA: 1.02 (ref 1.005–1.030)
Urobilinogen, Ur: 0.2 mg/dL (ref 0.2–1.0)
pH, UA: 5 (ref 5.0–7.5)

## 2021-04-15 LAB — MICROSCOPIC EXAMINATION
Bacteria, UA: NONE SEEN
Epithelial Cells (non renal): NONE SEEN /hpf (ref 0–10)

## 2021-08-26 ENCOUNTER — Ambulatory Visit: Payer: Self-pay | Admitting: Podiatry

## 2021-08-26 ENCOUNTER — Other Ambulatory Visit: Payer: Self-pay

## 2021-09-19 ENCOUNTER — Encounter: Payer: Medicare PPO | Admitting: Dermatology

## 2021-12-14 ENCOUNTER — Other Ambulatory Visit: Payer: Self-pay

## 2021-12-14 ENCOUNTER — Ambulatory Visit (INDEPENDENT_AMBULATORY_CARE_PROVIDER_SITE_OTHER): Payer: Medicare PPO | Admitting: Dermatology

## 2021-12-14 ENCOUNTER — Encounter: Payer: Self-pay | Admitting: Dermatology

## 2021-12-14 DIAGNOSIS — L739 Follicular disorder, unspecified: Secondary | ICD-10-CM

## 2021-12-14 DIAGNOSIS — L578 Other skin changes due to chronic exposure to nonionizing radiation: Secondary | ICD-10-CM

## 2021-12-14 DIAGNOSIS — L304 Erythema intertrigo: Secondary | ICD-10-CM

## 2021-12-14 DIAGNOSIS — Z86018 Personal history of other benign neoplasm: Secondary | ICD-10-CM

## 2021-12-14 DIAGNOSIS — Z1283 Encounter for screening for malignant neoplasm of skin: Secondary | ICD-10-CM | POA: Diagnosis not present

## 2021-12-14 DIAGNOSIS — L719 Rosacea, unspecified: Secondary | ICD-10-CM | POA: Diagnosis not present

## 2021-12-14 DIAGNOSIS — L219 Seborrheic dermatitis, unspecified: Secondary | ICD-10-CM | POA: Diagnosis not present

## 2021-12-14 DIAGNOSIS — D229 Melanocytic nevi, unspecified: Secondary | ICD-10-CM

## 2021-12-14 DIAGNOSIS — L570 Actinic keratosis: Secondary | ICD-10-CM

## 2021-12-14 DIAGNOSIS — L821 Other seborrheic keratosis: Secondary | ICD-10-CM

## 2021-12-14 DIAGNOSIS — D1800 Hemangioma unspecified site: Secondary | ICD-10-CM

## 2021-12-14 DIAGNOSIS — L814 Other melanin hyperpigmentation: Secondary | ICD-10-CM

## 2021-12-14 MED ORDER — METRONIDAZOLE 1 % EX GEL: Freq: Every day | CUTANEOUS | 3 refills | Status: DC

## 2021-12-14 MED ORDER — KETOCONAZOLE 2 % EX SHAM: MEDICATED_SHAMPOO | CUTANEOUS | 3 refills | Status: DC

## 2021-12-14 MED ORDER — EUCRISA 2 % EX OINT: TOPICAL_OINTMENT | CUTANEOUS | 3 refills | Status: DC

## 2021-12-14 MED ORDER — SELENIUM SULFIDE 2.5 % EX LOTN: TOPICAL_LOTION | CUTANEOUS | 3 refills | Status: DC

## 2021-12-14 NOTE — Progress Notes (Signed)
Follow-Up Visit   Subjective  Ryan Hicks is a 71 y.o. male who presents for the following: Annual Exam (Skin cancer screening. Full body. 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 Uses Metronidazole, Eucrisa, selenium sulfide solution, and ketoconazole shampoo).  He tends to pick/squeeze the bumps that come up.  The bumps are not itchy, it is more a habit.  He feels like the Selenium Sulfide lotion helps most with the folliculitis.  The patient presents for Total-Body Skin Exam (TBSE) for skin cancer screening and mole check.  The patient has spots, moles and lesions to be evaluated, some may be new or changing and the patient has concerns that these could be cancer.    The following portions of the chart were reviewed this encounter and updated as appropriate:      Review of Systems: No other skin or systemic complaints except as noted in HPI or Assessment and Plan.   Objective  Well appearing patient in no apparent distress; mood and affect are within normal limits.  A full examination was performed including scalp, head, eyes, ears, nose, lips, neck, chest, axillae, abdomen, back, buttocks, bilateral upper extremities, bilateral lower extremities, hands, feet, fingers, toes, fingernails, and toenails. All findings within normal limits unless otherwise noted below.  face Face clear today  Scalp, face Clear today  arms, torso, legs Pink-brown excoriated papules on right shoulder and right arm, left earlobe, chest, left arm.  Hyperpigmented macules and excoriations at legs.  Left Temple x2, right temple x2 (4) Pink/brown scaly macules   inguinal folds Clear today   Assessment & Plan   Lentigines - Scattered tan macules - Due to sun exposure - Benign-appearing, observe - Recommend daily broad spectrum sunscreen SPF 30+ to sun-exposed areas, reapply every 2 hours as needed. - Call for any changes  Seborrheic Keratoses - Stuck-on,  waxy, tan-brown papules and/or plaques  - Benign-appearing - Discussed benign etiology and prognosis. - Observe - Call for any changes  Melanocytic Nevi - Tan-brown and/or pink-flesh-colored symmetric macules and papules - Benign appearing on exam today - Observation - Call clinic for new or changing moles - Recommend daily use of broad spectrum spf 30+ sunscreen to sun-exposed areas.   Hemangiomas - Red papules - Discussed benign nature - Observe - Call for any changes  Actinic Damage - Chronic condition, secondary to cumulative UV/sun exposure - diffuse scaly erythematous macules with underlying dyspigmentation - Recommend daily broad spectrum sunscreen SPF 30+ to sun-exposed areas, reapply every 2 hours as needed.  - Staying in the shade or wearing long sleeves, sun glasses (UVA+UVB protection) and wide brim hats (4-inch brim around the entire circumference of the hat) are also recommended for sun protection.  - Call for new or changing lesions.  History of Dysplastic Nevus - No evidence of recurrence today at spinal upper back - Recommend regular full body skin exams - Recommend daily broad spectrum sunscreen SPF 30+ to sun-exposed areas, reapply every 2 hours as needed.  - Call if any new or changing lesions are noted between office visits   Skin cancer screening performed today.  Rosacea face  Chronic condition with duration or expected duration over one year. Currently well-controlled.   Rosacea is a chronic progressive skin condition usually affecting the face of adults, causing redness and/or acne bumps. It is treatable but not curable. It sometimes affects the eyes (ocular rosacea) as well. It may respond to topical and/or systemic medication  and can flare with stress, sun exposure, alcohol, exercise and some foods.  Daily application of broad spectrum spf 30+ sunscreen to face is recommended to reduce flares.  Continue Metronidazole 1% gel qd prn flares for  rosacea  Related Medications metroNIDAZOLE (METROGEL) 1 % gel Apply topically daily.  Seborrheic dermatitis Scalp, face  Chronic condition with duration or expected duration over one year. Currently well-controlled.   Use Eucrisa to aas face twice daily as needed.   Continue Ketoconazole 2% shampoo 2-3x/wk as directed.   Seborrheic Dermatitis  -  is a chronic persistent rash characterized by pinkness and scaling most commonly of the mid face but also can occur on the scalp (dandruff), ears; mid chest, mid back and groin.  It tends to be exacerbated by stress and cooler weather.  People who have neurologic disease may experience new onset or exacerbation of existing seborrheic dermatitis.  The condition is not curable but treatable and can be controlled.   Related Medications hydrocortisone 2.5 % cream Apply topically 2 (two) times daily as needed (Rash).  ketoconazole (NIZORAL) 2 % shampoo Use to wash beard 2-3 times weekly, letting sit a few minutes before rinsing.  Folliculitis arms, torso, legs  With excoriations. Chronic and persistent condition with duration or expected duration over one year. Condition is symptomatic/ bothersome to patient. Not currently at goal.   Continue Selenium Sulfide 2.5% lotion as directed. Apply to aas body qhs and wash off in AM.  May also use in shower as a body wash.  Let sit several minutes prior to rinsing.   Use Eucrisa ointment to body areas twice daily as needed for itch.  Avoid picking/scratching/squeezing   selenium sulfide (SELSUN) 2.5 % shampoo - arms, torso, legs Apply as directed to affected areas as needed.  Crisaborole (EUCRISA) 2 % OINT - arms, torso, legs Apply twice daily to affected areas on face and body as needed  AK (actinic keratosis) (4) Left Temple x2, right temple x2  Actinic keratoses are precancerous spots that appear secondary to cumulative UV radiation exposure/sun exposure over time. They are chronic with  expected duration over 1 year. A portion of actinic keratoses will progress to squamous cell carcinoma of the skin. It is not possible to reliably predict which spots will progress to skin cancer and so treatment is recommended to prevent development of skin cancer.  Recommend daily broad spectrum sunscreen SPF 30+ to sun-exposed areas, reapply every 2 hours as needed.  Recommend staying in the shade or wearing long sleeves, sun glasses (UVA+UVB protection) and wide brim hats (4-inch brim around the entire circumference of the hat). Call for new or changing lesions.  Destruction of lesion - Left Temple x2, right temple x2  Destruction method: cryotherapy   Informed consent: discussed and consent obtained   Lesion destroyed using liquid nitrogen: Yes   Region frozen until ice ball extended beyond lesion: Yes   Outcome: patient tolerated procedure well with no complications   Post-procedure details: wound care instructions given    Intertrigo inguinal folds  Chronic condition with duration or expected duration over one year. Currently well-controlled.   He tends to flare in the summertime  Resume Eucrisa ointment qd/bid as needed prn itchy rash He has used Oxistat lotion in the past, but doesn't use it anymore, and doesn't want to continue.  Intertrigo is a chronic recurrent rash that occurs in skin fold areas that may be associated with friction; heat; moisture; yeast; fungus; and bacteria.  It is exacerbated  by increased movement / activity; sweating; and higher atmospheric temperature.    Return in about 1 year (around 12/14/2022) for TBSE.  I, Emelia Salisbury, CMA, am acting as scribe for Brendolyn Patty, MD.  Documentation: I have reviewed the above documentation for accuracy and completeness, and I agree with the above.  Brendolyn Patty MD

## 2021-12-14 NOTE — Patient Instructions (Addendum)
Folliculitis  Resume Selenium Sulfide 2.5% solution as directed.   Use Eucrisa to body areas twice daily as needed.   Seborrheic dermatitis  Use Eucrisa to face twice daily as needed.   Continue Ketoconazole 2% shampoo as directed.   Rosacea  Continue Metronidazole 1% gel as directed for rosacea  Cryotherapy Aftercare  Wash gently with soap and water everyday.   Apply Vaseline and Band-Aid daily until healed.   Prior to procedure, discussed risks of blister formation, small wound, skin dyspigmentation, or rare scar following cryotherapy. Recommend Vaseline ointment to treated areas while healing.   Gentle Skin Care Guide  1. Bathe no more than once a day.  2. Avoid bathing in hot water  3. Use a mild soap like Dove, Vanicream, Cetaphil, CeraVe. Can use Lever 2000 or Cetaphil antibacterial soap  4. Use soap only where you need it. On most days, use it under your arms, between your legs, and on your feet. Let the water rinse other areas unless visibly dirty.  5. When you get out of the bath/shower, use a towel to gently blot your skin dry, don't rub it.  6. While your skin is still a little damp, apply a moisturizing cream such as Vanicream, CeraVe, Cetaphil, Eucerin, Sarna lotion or plain Vaseline Jelly. For hands apply Neutrogena [place] Hand Cream or Excipial Hand Cream.  7. Reapply moisturizer any time you start to itch or feel dry.  8. Sometimes using free and clear laundry detergents can be helpful. Fabric softener sheets should be avoided. Downy Free & Gentle liquid, or any liquid fabric softener that is free of dyes and perfumes, it acceptable to use  9. If your doctor has given you prescription creams you may apply moisturizers over them     If You Need Anything After Your Visit  If you have any questions or concerns for your doctor, please call our main line at [phone] and press option 4 to reach your doctor's medical assistant. If no one answers, please  leave a voicemail as directed and we will return your call as soon as possible. Messages left after 4 pm will be answered the following business day.   You may also send [place] a message via [place]. We typically respond to MyChart messages within 1-2 business days.  For prescription refills, please ask your pharmacy to contact our office. Our fax number is [phone].  If you have an urgent issue when the clinic is closed that cannot wait until the next business day, you can page your doctor at the number below.    Please note that while we do our best to be available for urgent issues outside of office hours, we are not available 24/7.   If you have an urgent issue and are unable to reach [place], you may choose to seek medical care at your doctor's office, retail clinic, urgent care center, or emergency room.  If you have a medical emergency, please immediately call 911 or go to the emergency department.  Pager Numbers  - Dr. Nehemiah Massed: [phone]  - Dr. Laurence Ferrari: [phone]  - Dr. Nicole Kindred: [phone]  In the event of inclement weather, please call our main line at [phone] for an update on the status of any delays or closures.  Dermatology Medication Tips: Please keep the boxes that topical medications come in in order to help keep track of the instructions about where and how to use these. Pharmacies typically print the medication instructions only on the boxes and not directly  on the medication tubes.   If your medication is too expensive, please contact our office at [phone] option 4 or send [place] a message through [place].   We are unable to tell what your co-pay for medications will be in advance as this is different depending on your insurance coverage. However, we may be able to find a substitute medication at lower cost or fill out paperwork to get insurance to cover a needed medication.   If a prior authorization is required to get your medication covered by your insurance  company, please allow [place] 1-2 business days to complete this process.  Drug prices often vary depending on where the prescription is filled and some pharmacies may offer cheaper prices.  The website www.goodrx.com contains coupons for medications through different pharmacies. The prices here do not account for what the cost may be with help from insurance (it may be cheaper with your insurance), but the website can give you the price if you did not use any insurance.  - You can print the associated coupon and take it with your prescription to the pharmacy.  - You may also stop by our office during regular business hours and pick up a GoodRx coupon card.  - If you need your prescription sent electronically to a different pharmacy, notify our office through [facility] or by phone at [phone] option 4.     Si Usted Necesita Algo Despus de Su Visita  Tambin puede enviarnos un mensaje a travs de Pharmacist, community. Por lo general respondemos a los mensajes de MyChart en el transcurso de 1 a 2 das hbiles.  Para renovar recetas, por favor pida a su farmacia que se ponga en contacto con nuestra oficina. Harland Dingwall de fax es [place] [phone].  Si tiene un asunto urgente cuando la clnica est cerrada y que no puede esperar hasta el siguiente da hbil, puede llamar/localizar a su doctor(a) al nmero que aparece a continuacin.   Por favor, tenga en cuenta que aunque hacemos todo lo posible para estar disponibles para asuntos urgentes fuera del horario de [place], no estamos disponibles las 24 horas del da, los 7 das de la [place].   Si tiene un problema urgente y no puede comunicarse con nosotros, puede optar por buscar atencin mdica  en el consultorio de su doctor(a), en una clnica privada, en un centro de atencin urgente o en una sala de emergencias.  Si tiene Engineering geologist, por favor llame inmediatamente al 911 o vaya a la sala de emergencias.  Nmeros de bper  - Dr.  Nehemiah Massed: [phone]  - Dra. Moye: [phone]  - Dra. Nicole Kindred: [phone]  En caso de inclemencias del [place], por favor llame a Johnsie Kindred principal al [phone] para una actualizacin sobre el [place] de cualquier retraso o cierre.  Consejos para la medicacin en dermatologa: Por favor, guarde las cajas en las que vienen los medicamentos de uso tpico para ayudarle a seguir las instrucciones sobre dnde y cmo usarlos. Las farmacias generalmente imprimen las instrucciones del medicamento slo en las cajas y no directamente en los tubos del [place].   Si su medicamento es muy caro, por favor, pngase en contacto con Zigmund Daniel llamando al [phone] y presione la opcin 4 o envenos un mensaje a travs de Pharmacist, community.   No podemos decirle cul ser su copago por los medicamentos por adelantado ya que esto es diferente dependiendo de la cobertura de su seguro. Sin embargo, es posible que podamos encontrar un medicamento sustituto  a Electrical engineer un formulario para que el seguro cubra el medicamento que se considera necesario.   Si se requiere una autorizacin previa para que su compaa de seguros [place] su medicamento, por favor permtanos de 1 a 2 das hbiles para completar este proceso.  Los precios de los medicamentos varan con frecuencia dependiendo del Environmental consultant de dnde se surte la receta y alguna farmacias pueden ofrecer precios ms baratos.  El sitio web www.goodrx.com tiene cupones para medicamentos de Airline pilot. Los precios aqu no tienen en cuenta lo que podra costar con la ayuda del seguro (puede ser ms barato con su seguro), pero el sitio web puede darle el precio si no utiliz Research scientist (physical sciences).  - Puede imprimir el cupn correspondiente y llevarlo con su receta a la farmacia.  - Tambin puede pasar por nuestra oficina durante el horario de atencin regular y Charity fundraiser una tarjeta de cupones de GoodRx.  - Si necesita que su receta se enve  electrnicamente a una farmacia diferente, informe a nuestra oficina a travs de MyChart de [place] o por telfono llamando al [phone] y presione la opcin 4.

## 2022-04-13 ENCOUNTER — Ambulatory Visit: Payer: Self-pay | Admitting: Urology

## 2022-04-21 ENCOUNTER — Encounter: Payer: Self-pay | Admitting: Urology

## 2022-07-17 ENCOUNTER — Other Ambulatory Visit (HOSPITAL_COMMUNITY): Payer: Self-pay | Admitting: Neurology

## 2022-07-17 ENCOUNTER — Other Ambulatory Visit: Payer: Self-pay | Admitting: Neurology

## 2022-07-17 DIAGNOSIS — F02818 Dementia in other diseases classified elsewhere, unspecified severity, with other behavioral disturbance: Secondary | ICD-10-CM

## 2022-08-04 ENCOUNTER — Other Ambulatory Visit: Payer: Medicare PPO

## 2022-08-21 ENCOUNTER — Ambulatory Visit (HOSPITAL_COMMUNITY)
Admission: RE | Admit: 2022-08-21 | Discharge: 2022-08-21 | Disposition: A | Discharge status: Home / Self Care | Payer: Medicare PPO | Source: Ambulatory Visit | Attending: Neurology | Admitting: Neurology

## 2022-08-21 DIAGNOSIS — F02818 Dementia in other diseases classified elsewhere, unspecified severity, with other behavioral disturbance: Secondary | ICD-10-CM | POA: Diagnosis present

## 2022-08-21 DIAGNOSIS — F01518 Vascular dementia, unspecified severity, with other behavioral disturbance: Secondary | ICD-10-CM | POA: Insufficient documentation

## 2022-08-21 DIAGNOSIS — G309 Alzheimer's disease, unspecified: Secondary | ICD-10-CM | POA: Insufficient documentation

## 2022-11-08 ENCOUNTER — Other Ambulatory Visit: Payer: Self-pay | Admitting: *Deleted

## 2022-11-08 ENCOUNTER — Telehealth: Payer: Self-pay | Admitting: Urology

## 2022-11-08 ENCOUNTER — Other Ambulatory Visit: Payer: Self-pay | Admitting: Urology

## 2022-11-08 MED ORDER — TAMSULOSIN HCL 0.4 MG PO CAPS: 0.4000 mg | ORAL_CAPSULE | Freq: Every day | ORAL | 0 refills | Status: DC

## 2022-11-08 NOTE — Telephone Encounter (Signed)
Sent a month of medication to [facility]

## 2022-11-08 NOTE — Telephone Encounter (Signed)
Pt is leaving town tomorrow for 2 weeks for a wedding.  He will schedule a f/u appt, (he missed last one), but has only 4 Tamsulosin left.  He wants to know if [facility] will call in 2 weeks, or 1 month RX to [facility] prior to him coming on for appt.

## 2022-11-22 ENCOUNTER — Ambulatory Visit (INDEPENDENT_AMBULATORY_CARE_PROVIDER_SITE_OTHER): Payer: Medicare PPO | Admitting: Urology

## 2022-11-22 VITALS — BP 185/84 | HR 86 | Ht 71.0 in | Wt 230.0 lb

## 2022-11-22 DIAGNOSIS — N401 Enlarged prostate with lower urinary tract symptoms: Secondary | ICD-10-CM | POA: Diagnosis not present

## 2022-11-22 LAB — MICROSCOPIC EXAMINATION: Bacteria, UA: NONE SEEN

## 2022-11-22 LAB — URINALYSIS, COMPLETE
Bilirubin, UA: NEGATIVE
Leukocytes,UA: NEGATIVE
Nitrite, UA: NEGATIVE
Specific Gravity, UA: 1.01 (ref 1.005–1.030)
Urobilinogen, Ur: 0.2 mg/dL (ref 0.2–1.0)
pH, UA: 5.5 (ref 5.0–7.5)

## 2022-11-22 LAB — BLADDER SCAN AMB NON-IMAGING: Scan Result: 0

## 2022-11-22 MED ORDER — TAMSULOSIN HCL 0.4 MG PO CAPS: 0.4000 mg | ORAL_CAPSULE | Freq: Every day | ORAL | 3 refills | Status: AC

## 2022-11-22 NOTE — Progress Notes (Signed)
11/22/2022 2:21 PM   Ryan Hicks 28-Sep-1951 [id number]  Referring provider: Baxter Hire, MD [place],  [place] [address]  Chief Complaint  Patient presents with   Benign Prostatic Hypertrophy     Urologic problem list: BPH with LUTS symptoms tamsulosin prn  2.  Erectile dysfunction Failed PDE 5 inhibitors  HPI: 72 y.o. male presents for annual follow-up.  He presents today with his wife  No significant problems since last years visit Has been taking tamsulosin as needed and typically does when he is traveling or not at home Denies dysuria, gross hematuria Denies flank, abdominal or pelvic pain Has requested to continue PSA screening and last PSA was in 2022   PMH: Past Medical History:  Diagnosis Date   Diabetes mellitus without complication ([place])    Enlarged prostate with lower urinary tract symptoms (LUTS)    Heterozygous factor V Leiden mutation ([place])    History of dysplastic nevus 08/20/2017   spinal upper back, mild atypia, limited margins free   Hyperlipidemia    Nephrolithiasis    Pseudophakia of both eyes    Stroke [facility])    Testicular hypofunction    Vertebral artery occlusion     Surgical History: Past Surgical History:  Procedure Laterality Date   BACK SURGERY     COLONOSCOPY WITH PROPOFOL N/A 07/23/2018   Procedure: COLONOSCOPY WITH PROPOFOL;  Surgeon: Toledo, Benay Pike, MD;  Location: [facility] ENDOSCOPY;  Service: Gastroenterology;  Laterality: N/A;   DENTAL EXAMINATION UNDER ANESTHESIA     TONSILLECTOMY      Home Medications:  Allergies as of 11/22/2022   No Known Allergies      Medication List        Accurate as of November 22, 2022  2:21 PM. If you have any questions, ask your nurse or doctor.          amLODipine 2.5 MG tablet Commonly known as: NORVASC Take by mouth.   aspirin EC 81 MG tablet Take by mouth.   atorvastatin 80 MG tablet Commonly known as: LIPITOR Take by mouth.   clobetasol 0.05 %  external solution Commonly known as: TEMOVATE   erythromycin ophthalmic ointment   Eucrisa 2 % Oint Generic drug: Crisaborole Apply twice daily to affected areas on face and body as needed   glipiZIDE 10 MG tablet Commonly known as: GLUCOTROL   glucosamine-chondroitin 500-400 MG tablet Take 1 tablet by mouth 3 (three) times daily.   hydrocortisone 2.5 % cream Apply topically 2 (two) times daily as needed (Rash).   Januvia 25 MG tablet Generic drug: sitaGLIPtin   ketoconazole 2 % shampoo Commonly known as: NIZORAL Use to wash beard 2-3 times weekly, letting sit a few minutes before rinsing.   Magnesium 250 MG Tabs Take by mouth.   metFORMIN 1000 MG tablet Commonly known as: GLUCOPHAGE   metoprolol succinate 25 MG 24 hr tablet Commonly known as: TOPROL-XL Take by mouth.   metroNIDAZOLE 1 % gel Commonly known as: METROGEL Apply topically daily.   oxiconazole 1 % Crea Commonly known as: OXISTAT Apply to affected areas as directed as needed   pimecrolimus 1 % cream Commonly known as: Elidel Apply to affected areas as directed as needed.   selenium sulfide 2.5 % lotion Commonly known as: SELSUN Apply as directed to affected areas as needed.   Semaglutide(0.25 or 0.'5MG'$ /DOS) 2 MG/1.5ML Sopn Inject into the skin.   sodium fluoride 1.1 % Crea dental cream Commonly known as: PREVIDENT 5000 PLUS  tamsulosin 0.4 MG Caps capsule Commonly known as: FLOMAX Take 1 capsule (0.4 mg total) by mouth daily.        Allergies: No Known Allergies  Family History: No family history on file.  Social History:  reports that he has never smoked. He has never used smokeless tobacco. He reports that he does not drink alcohol and does not use drugs.   Physical Exam: BP (!) 185/84   Pulse 86   Ht '5\' 11"'$  (1.803 m)   Wt 230 lb (104.3 kg)   BMI 32.08 kg/m   Constitutional:  Alert, No acute distress. HEENT: [place] AT Respiratory: Normal respiratory effort, no increased work  of breathing. Skin: No rashes, bruises or suspicious lesions. Psychiatric: Normal mood and affect.   Assessment & Plan:    1. Benign prostatic hyperplasia with LUTS  UA today unremarkable Bladder scan PVR 0 mL Tamsulosin refilled Continue annual follow-up  2.  Prostate cancer screening He states his he is scheduled to have blood work drawn with his PCP in the next few weeks and we will see if they will add on a [place], [place] [address] [place], [place] [address] [phone]

## 2022-11-23 ENCOUNTER — Encounter: Payer: Self-pay | Admitting: Urology

## 2022-12-25 ENCOUNTER — Encounter: Payer: Medicare PPO | Admitting: Dermatology

## 2023-09-11 ENCOUNTER — Ambulatory Visit: Payer: Medicare PPO | Admitting: Dermatology

## 2023-09-17 ENCOUNTER — Ambulatory Visit: Payer: Medicare PPO | Admitting: Dermatology

## 2023-09-17 ENCOUNTER — Encounter: Payer: Self-pay | Admitting: Dermatology

## 2023-09-17 DIAGNOSIS — S40811A Abrasion of right upper arm, initial encounter: Secondary | ICD-10-CM

## 2023-09-17 DIAGNOSIS — S40211A Abrasion of right shoulder, initial encounter: Secondary | ICD-10-CM

## 2023-09-17 DIAGNOSIS — L578 Other skin changes due to chronic exposure to nonionizing radiation: Secondary | ICD-10-CM | POA: Diagnosis not present

## 2023-09-17 DIAGNOSIS — L739 Follicular disorder, unspecified: Secondary | ICD-10-CM

## 2023-09-17 DIAGNOSIS — L814 Other melanin hyperpigmentation: Secondary | ICD-10-CM | POA: Diagnosis not present

## 2023-09-17 DIAGNOSIS — S20419A Abrasion of unspecified back wall of thorax, initial encounter: Secondary | ICD-10-CM

## 2023-09-17 DIAGNOSIS — W908XXA Exposure to other nonionizing radiation, initial encounter: Secondary | ICD-10-CM

## 2023-09-17 DIAGNOSIS — L304 Erythema intertrigo: Secondary | ICD-10-CM

## 2023-09-17 DIAGNOSIS — Z86018 Personal history of other benign neoplasm: Secondary | ICD-10-CM

## 2023-09-17 DIAGNOSIS — T148XXA Other injury of unspecified body region, initial encounter: Secondary | ICD-10-CM

## 2023-09-17 DIAGNOSIS — Z1283 Encounter for screening for malignant neoplasm of skin: Secondary | ICD-10-CM

## 2023-09-17 DIAGNOSIS — S40812A Abrasion of left upper arm, initial encounter: Secondary | ICD-10-CM

## 2023-09-17 DIAGNOSIS — L821 Other seborrheic keratosis: Secondary | ICD-10-CM

## 2023-09-17 DIAGNOSIS — L219 Seborrheic dermatitis, unspecified: Secondary | ICD-10-CM

## 2023-09-17 DIAGNOSIS — D229 Melanocytic nevi, unspecified: Secondary | ICD-10-CM

## 2023-09-17 DIAGNOSIS — S40212A Abrasion of left shoulder, initial encounter: Secondary | ICD-10-CM

## 2023-09-17 DIAGNOSIS — D1801 Hemangioma of skin and subcutaneous tissue: Secondary | ICD-10-CM

## 2023-09-17 DIAGNOSIS — L719 Rosacea, unspecified: Secondary | ICD-10-CM

## 2023-09-17 NOTE — Progress Notes (Signed)
Follow-Up Visit   Subjective  Ryan Hicks is a 72 y.o. male who presents for the following: Skin Cancer Screening and Full Body Skin Exam  The patient presents for Total-Body Skin Exam (TBSE) for skin cancer screening and mole check. The patient has spots, moles and lesions to be evaluated, some may be new or changing and the patient may have concern these could be cancer.  The following portions of the chart were reviewed this encounter and updated as appropriate: medications, allergies, medical history  Review of Systems:  No other skin or systemic complaints except as noted in HPI or Assessment and Plan.  Objective  Well appearing patient in no apparent distress; mood and affect are within normal limits.  A full examination was performed including scalp, head, eyes, ears, nose, lips, neck, chest, axillae, abdomen, back, buttocks, bilateral upper extremities, bilateral lower extremities, hands, feet, fingers, toes, fingernails, and toenails. All findings within normal limits unless otherwise noted below.   Relevant physical exam findings are noted in the Assessment and Plan.   Assessment & Plan   SKIN CANCER SCREENING PERFORMED TODAY.  ACTINIC DAMAGE - Chronic condition, secondary to cumulative UV/sun exposure - diffuse scaly erythematous macules with underlying dyspigmentation - Recommend daily broad spectrum sunscreen SPF 30+ to sun-exposed areas, reapply every 2 hours as needed.  - Staying in the shade or wearing long sleeves, sun glasses (UVA+UVB protection) and wide brim hats (4-inch brim around the entire circumference of the hat) are also recommended for sun protection.  - Call for new or changing lesions.  LENTIGINES, SEBORRHEIC KERATOSES, HEMANGIOMAS - Benign normal skin lesions - Benign-appearing - Call for any changes  MELANOCYTIC NEVI - Tan-brown and/or pink-flesh-colored symmetric macules and papules - Benign appearing on exam today - Observation - Call  clinic for new or changing moles - Recommend daily use of broad spectrum spf 30+ sunscreen to sun-exposed areas.   HISTORY OF DYSPLASTIC NEVUS No evidence of recurrence today Recommend regular full body skin exams Recommend daily broad spectrum sunscreen SPF 30+ to sun-exposed areas, reapply every 2 hours as needed.  Call if any new or changing lesions are noted between office visits  Excoriations: Exam: excoriated pruriginous plaques on upper back shoulders upper arms. Patient reports scratching and picking  Plan: Eucrisa BID Avoid scratching  Prior problems carried forward for documentation: Rosacea face   Chronic condition with duration or expected duration over one year. Currently well-controlled.    Rosacea is a chronic progressive skin condition usually affecting the face of adults, causing redness and/or acne bumps. It is treatable but not curable. It sometimes affects the eyes (ocular rosacea) as well. It may respond to topical and/or systemic medication and can flare with stress, sun exposure, alcohol, exercise and some foods.  Daily application of broad spectrum spf 30+ sunscreen to face is recommended to reduce flares.   Continue Metronidazole 1% gel qd prn flares for rosacea  Seborrheic dermatitis Scalp, face   Chronic condition with duration or expected duration over one year. Currently well-controlled.    Use Eucrisa to aas face twice daily as needed.    Continue Ketoconazole 2% shampoo 2-3x/wk as directed.    Seborrheic Dermatitis  -  is a chronic persistent rash characterized by pinkness and scaling most commonly of the mid face but also can occur on the scalp (dandruff), ears; mid chest, mid back and groin.  It tends to be exacerbated by stress and cooler weather.  People who have neurologic disease may  experience new onset or exacerbation of existing seborrheic dermatitis.  The condition is not curable but treatable and can be controlled.   Folliculitis arms,  torso, legs   With excoriations. Chronic and persistent condition with duration or expected duration over one year. Condition is symptomatic/ bothersome to patient. Not currently at goal.    Continue Selenium Sulfide 2.5% lotion as directed. Apply to aas body qhs and wash off in AM.  May also use in shower as a body wash.  Let sit several minutes prior to rinsing.    Use Eucrisa ointment to body areas twice daily as needed for itch.   Avoid picking/scratching/squeezing   Intertrigo inguinal folds   Chronic condition with duration or expected duration over one year. Currently well-controlled.    He tends to flare in the summertime   Resume Eucrisa ointment qd/bid as needed prn itchy rash He has used Oxistat lotion in the past, but doesn't use it anymore, and doesn't want to continue.   Intertrigo is a chronic recurrent rash that occurs in skin fold areas that may be associated with friction; heat; moisture; yeast; fungus; and bacteria.  It is exacerbated by increased movement / activity; sweating; and higher atmospheric temperature.   Return in about 1 year (around 09/16/2024) for TBSE.  Maylene Roes, CMA, am acting as scribe for Elie Goody, MD .  Documentation: I have reviewed the above documentation for accuracy and completeness, and I agree with the above.  Elie Goody, MD

## 2023-09-17 NOTE — Patient Instructions (Signed)

## 2023-11-02 ENCOUNTER — Encounter: Payer: Self-pay | Admitting: Urology

## 2023-11-23 ENCOUNTER — Ambulatory Visit: Payer: Medicare PPO | Admitting: Urology

## 2023-12-03 ENCOUNTER — Ambulatory Visit: Payer: Medicare PPO | Admitting: Urology

## 2023-12-14 ENCOUNTER — Observation Stay
Admission: EM | Admit: 2023-12-14 | Discharge: 2023-12-24 | Disposition: A | Discharge status: Home / Self Care | Payer: Medicare PPO | Attending: Emergency Medicine | Admitting: Emergency Medicine

## 2023-12-14 ENCOUNTER — Other Ambulatory Visit: Payer: Self-pay

## 2023-12-14 ENCOUNTER — Inpatient Hospital Stay: Payer: Medicare PPO

## 2023-12-14 ENCOUNTER — Emergency Department: Payer: Medicare PPO

## 2023-12-14 DIAGNOSIS — Z79899 Other long term (current) drug therapy: Secondary | ICD-10-CM | POA: Insufficient documentation

## 2023-12-14 DIAGNOSIS — E1310 Other specified diabetes mellitus with ketoacidosis without coma: Secondary | ICD-10-CM | POA: Diagnosis not present

## 2023-12-14 DIAGNOSIS — E1110 Type 2 diabetes mellitus with ketoacidosis without coma: Principal | ICD-10-CM | POA: Diagnosis present

## 2023-12-14 DIAGNOSIS — F0390 Unspecified dementia without behavioral disturbance: Secondary | ICD-10-CM | POA: Diagnosis not present

## 2023-12-14 DIAGNOSIS — F03C0 Unspecified dementia, severe, without behavioral disturbance, psychotic disturbance, mood disturbance, and anxiety: Secondary | ICD-10-CM | POA: Diagnosis not present

## 2023-12-14 DIAGNOSIS — E871 Hypo-osmolality and hyponatremia: Secondary | ICD-10-CM | POA: Insufficient documentation

## 2023-12-14 DIAGNOSIS — Z8673 Personal history of transient ischemic attack (TIA), and cerebral infarction without residual deficits: Secondary | ICD-10-CM

## 2023-12-14 DIAGNOSIS — I10 Essential (primary) hypertension: Secondary | ICD-10-CM | POA: Diagnosis not present

## 2023-12-14 DIAGNOSIS — I480 Paroxysmal atrial fibrillation: Secondary | ICD-10-CM | POA: Insufficient documentation

## 2023-12-14 DIAGNOSIS — Z7901 Long term (current) use of anticoagulants: Secondary | ICD-10-CM | POA: Insufficient documentation

## 2023-12-14 DIAGNOSIS — R296 Repeated falls: Secondary | ICD-10-CM | POA: Diagnosis not present

## 2023-12-14 DIAGNOSIS — E1169 Type 2 diabetes mellitus with other specified complication: Secondary | ICD-10-CM

## 2023-12-14 DIAGNOSIS — Z7984 Long term (current) use of oral hypoglycemic drugs: Secondary | ICD-10-CM | POA: Diagnosis not present

## 2023-12-14 DIAGNOSIS — W19XXXA Unspecified fall, initial encounter: Secondary | ICD-10-CM | POA: Diagnosis not present

## 2023-12-14 DIAGNOSIS — E1165 Type 2 diabetes mellitus with hyperglycemia: Secondary | ICD-10-CM | POA: Diagnosis not present

## 2023-12-14 DIAGNOSIS — R627 Adult failure to thrive: Secondary | ICD-10-CM | POA: Diagnosis present

## 2023-12-14 LAB — COMPREHENSIVE METABOLIC PANEL
ALT: 12 U/L (ref 0–44)
AST: 13 U/L — ABNORMAL LOW (ref 15–41)
Albumin: 3.6 g/dL (ref 3.5–5.0)
Alkaline Phosphatase: 78 U/L (ref 38–126)
Anion gap: 23 — ABNORMAL HIGH (ref 5–15)
BUN: 41 mg/dL — ABNORMAL HIGH (ref 8–23)
CO2: 14 mmol/L — ABNORMAL LOW (ref 22–32)
Calcium: 9.4 mg/dL (ref 8.9–10.3)
Chloride: 93 mmol/L — ABNORMAL LOW (ref 98–111)
Creatinine, Ser: 1.2 mg/dL (ref 0.61–1.24)
GFR, Estimated: >60 mL/min (ref >60)
Glucose, Bld: 425 mg/dL — ABNORMAL HIGH (ref 70–99)
Potassium: 3.8 mmol/L (ref 3.5–5.1)
Sodium: 130 mmol/L — ABNORMAL LOW (ref 135–145)
Total Bilirubin: 2.1 mg/dL — ABNORMAL HIGH (ref 0.0–1.2)
Total Protein: 7.2 g/dL (ref 6.5–8.1)

## 2023-12-14 LAB — BLOOD GAS, VENOUS
Acid-base deficit: 10.7 mmol/L — ABNORMAL HIGH (ref 0.0–2.0)
Acid-base deficit: 12.7 mmol/L — ABNORMAL HIGH (ref 0.0–2.0)
Bicarbonate: 15.9 mmol/L — ABNORMAL LOW (ref 20.0–28.0)
Bicarbonate: 13.2 mmol/L — ABNORMAL LOW (ref 20.0–28.0)
O2 Saturation: 63.8 %
O2 Saturation: 71.5 %
Patient temperature: 37
Patient temperature: 37
pCO2, Ven: 37 mm[Hg] — ABNORMAL LOW (ref 44–60)
pCO2, Ven: 30 mm[Hg] — ABNORMAL LOW (ref 44–60)
pH, Ven: 7.24 — ABNORMAL LOW (ref 7.25–7.43)
pH, Ven: 7.25 (ref 7.25–7.43)
pO2, Ven: 40 mm[Hg] (ref 32–45)
pO2, Ven: 40 mm[Hg] (ref 32–45)

## 2023-12-14 LAB — BASIC METABOLIC PANEL
Anion gap: 23 — ABNORMAL HIGH (ref 5–15)
BUN: 39 mg/dL — ABNORMAL HIGH (ref 8–23)
CO2: 13 mmol/L — ABNORMAL LOW (ref 22–32)
Calcium: 9.1 mg/dL (ref 8.9–10.3)
Chloride: 97 mmol/L — ABNORMAL LOW (ref 98–111)
Creatinine, Ser: 1.22 mg/dL (ref 0.61–1.24)
GFR, Estimated: >60 mL/min (ref >60)
Glucose, Bld: 216 mg/dL — ABNORMAL HIGH (ref 70–99)
Potassium: 3 mmol/L — ABNORMAL LOW (ref 3.5–5.1)
Sodium: 133 mmol/L — ABNORMAL LOW (ref 135–145)

## 2023-12-14 LAB — CBG MONITORING, ED
Glucose-Capillary: 130 mg/dL — ABNORMAL HIGH (ref 70–99)
Glucose-Capillary: 138 mg/dL — ABNORMAL HIGH (ref 70–99)
Glucose-Capillary: 194 mg/dL — ABNORMAL HIGH (ref 70–99)
Glucose-Capillary: 277 mg/dL — ABNORMAL HIGH (ref 70–99)
Glucose-Capillary: 326 mg/dL — ABNORMAL HIGH (ref 70–99)
Glucose-Capillary: 407 mg/dL — ABNORMAL HIGH (ref 70–99)
Glucose-Capillary: 410 mg/dL — ABNORMAL HIGH (ref 70–99)
Glucose-Capillary: 72 mg/dL (ref 70–99)
Glucose-Capillary: 86 mg/dL (ref 70–99)

## 2023-12-14 LAB — URINALYSIS, ROUTINE W REFLEX MICROSCOPIC
Bacteria, UA: NONE SEEN
Bilirubin Urine: NEGATIVE
Glucose, UA: >=500 mg/dL — AB
Ketones, ur: 20 mg/dL — AB
Leukocytes,Ua: NEGATIVE
Nitrite: NEGATIVE
Protein, ur: 100 mg/dL — AB
Specific Gravity, Urine: 1.027 (ref 1.005–1.030)
Squamous Epithelial / HPF: 0 /[HPF] (ref 0–5)
pH: 5 (ref 5.0–8.0)

## 2023-12-14 LAB — BETA-HYDROXYBUTYRIC ACID
Beta-Hydroxybutyric Acid: 6.5 mmol/L — ABNORMAL HIGH (ref 0.05–0.27)
Beta-Hydroxybutyric Acid: 7.64 mmol/L — ABNORMAL HIGH (ref 0.05–0.27)

## 2023-12-14 LAB — CBC
HCT: 45.9 % (ref 39.0–52.0)
Hemoglobin: 16.6 g/dL (ref 13.0–17.0)
MCH: 30.2 pg (ref 26.0–34.0)
MCHC: 36.2 g/dL — ABNORMAL HIGH (ref 30.0–36.0)
MCV: 83.6 fL (ref 80.0–100.0)
Platelets: 325 10*3/uL (ref 150–400)
RBC: 5.49 MIL/uL (ref 4.22–5.81)
RDW: 11.9 % (ref 11.5–15.5)
WBC: 12.8 10*3/uL — ABNORMAL HIGH (ref 4.0–10.5)
nRBC: 0 % (ref 0.0–0.2)

## 2023-12-14 LAB — MAGNESIUM: Magnesium: 1.9 mg/dL (ref 1.7–2.4)

## 2023-12-14 LAB — HEMOGLOBIN A1C
Hgb A1c MFr Bld: 11.1 % — ABNORMAL HIGH (ref 4.8–5.6)
Mean Plasma Glucose: 271.87 mg/dL

## 2023-12-14 MED ORDER — POTASSIUM CHLORIDE 10 MEQ/100ML IV SOLN: 10.0000 meq | INTRAVENOUS | Status: DC

## 2023-12-14 MED ORDER — ACETAMINOPHEN 650 MG RE SUPP: 650.0000 mg | Freq: Four times a day (QID) | RECTAL | Status: DC | PRN

## 2023-12-14 MED ORDER — DEXTROSE IN LACTATED RINGERS 5 % IV SOLN
INTRAVENOUS | Status: DC
Start: 2023-12-14 — End: 2023-12-14

## 2023-12-14 MED ORDER — ONDANSETRON HCL 4 MG/2ML IJ SOLN: 4.0000 mg | Freq: Four times a day (QID) | INTRAMUSCULAR | Status: DC | PRN

## 2023-12-14 MED ORDER — LACTATED RINGERS IV BOLUS
20.0000 mL/kg | Freq: Once | INTRAVENOUS | Status: AC
  Administered 2023-12-14: 1924 mL via INTRAVENOUS

## 2023-12-14 MED ORDER — INSULIN REGULAR(HUMAN) IN NACL 100-0.9 UT/100ML-% IV SOLN
INTRAVENOUS | Status: DC
  Administered 2023-12-14: 15 [IU]/h via INTRAVENOUS
  Administered 2023-12-14: 2.4 [IU]/h via INTRAVENOUS
  Filled 2023-12-14 (×2): qty 100

## 2023-12-14 MED ORDER — INSULIN REGULAR(HUMAN) IN NACL 100-0.9 UT/100ML-% IV SOLN: INTRAVENOUS | Status: DC

## 2023-12-14 MED ORDER — ACETAMINOPHEN 325 MG PO TABS
650.0000 mg | ORAL_TABLET | Freq: Four times a day (QID) | ORAL | Status: DC | PRN
  Administered 2023-12-15 – 2023-12-23 (×4): 650 mg via ORAL
  Filled 2023-12-14 (×4): qty 2

## 2023-12-14 MED ORDER — POTASSIUM CHLORIDE 10 MEQ/100ML IV SOLN
10.0000 meq | INTRAVENOUS | Status: AC
  Administered 2023-12-14 (×2): 10 meq via INTRAVENOUS
  Filled 2023-12-14 (×2): qty 100

## 2023-12-14 MED ORDER — LACTATED RINGERS IV BOLUS: 20.0000 mL/kg | Freq: Once | INTRAVENOUS | Status: DC

## 2023-12-14 MED ORDER — SODIUM CHLORIDE 0.9 % IV BOLUS: 1000.0000 mL | Freq: Once | INTRAVENOUS | Status: DC

## 2023-12-14 MED ORDER — ONDANSETRON HCL 4 MG PO TABS: 4.0000 mg | ORAL_TABLET | Freq: Four times a day (QID) | ORAL | Status: DC | PRN

## 2023-12-14 MED ORDER — POTASSIUM CHLORIDE CRYS ER 20 MEQ PO TBCR
40.0000 meq | EXTENDED_RELEASE_TABLET | Freq: Once | ORAL | Status: AC
  Administered 2023-12-14: 40 meq via ORAL
  Filled 2023-12-14: qty 2

## 2023-12-14 MED ORDER — ENOXAPARIN SODIUM 40 MG/0.4ML IJ SOSY
40.0000 mg | PREFILLED_SYRINGE | INTRAMUSCULAR | Status: DC
  Administered 2023-12-14 – 2023-12-23 (×10): 40 mg via SUBCUTANEOUS
  Filled 2023-12-14 (×10): qty 0.4

## 2023-12-14 MED ORDER — LACTATED RINGERS IV SOLN: INTRAVENOUS | Status: AC

## 2023-12-14 MED ORDER — DEXTROSE IN LACTATED RINGERS 5 % IV SOLN: INTRAVENOUS | Status: AC

## 2023-12-14 MED ORDER — LACTATED RINGERS IV BOLUS
1000.0000 mL | Freq: Once | INTRAVENOUS | Status: AC
  Administered 2023-12-14: 1000 mL via INTRAVENOUS

## 2023-12-14 MED ORDER — LABETALOL HCL 5 MG/ML IV SOLN
10.0000 mg | INTRAVENOUS | Status: DC | PRN
  Filled 2023-12-14: qty 4

## 2023-12-14 MED ORDER — MAGNESIUM SULFATE IN D5W 1-5 GM/100ML-% IV SOLN
1.0000 g | Freq: Once | INTRAVENOUS | Status: AC
  Administered 2023-12-14: 1 g via INTRAVENOUS
  Filled 2023-12-14: qty 100

## 2023-12-14 MED ORDER — DEXTROSE 50 % IV SOLN: 0.0000 mL | INTRAVENOUS | Status: DC | PRN

## 2023-12-14 MED ORDER — POTASSIUM CHLORIDE 10 MEQ/100ML IV SOLN
10.0000 meq | INTRAVENOUS | Status: AC
  Administered 2023-12-14 – 2023-12-15 (×4): 10 meq via INTRAVENOUS
  Filled 2023-12-14 (×4): qty 100

## 2023-12-14 NOTE — Progress Notes (Signed)
       CROSS COVER NOTE  NAME: JAMARR TREINEN MRN: [id number] DOB : 20-Nov-1950 ATTENDING PHYSICIAN: Floydene Flock, MD    Date of Service   12/14/2023   HPI/Events of Note   Added to chat regarding patient admitted with DKA now in A fib  Interventions   Assessment/Plan:    12/14/2023    6:45 PM 12/14/2023    6:30 PM 12/14/2023    6:00 PM  Vitals with BMI  Systolic  139 128  Diastolic  81 92  Pulse 107  61   EKG a fib rate controlled 119, BP stable; no history of palpitations irregular rhythms Frequent falls recently however are concerning  Electrolytes replaced Additional 2 L LR given Repeat EKG a fib resolved - frequent PVC ongoing - consider cardiology consult Acidosis resolved - transition off IV insulin orders entered 10 semglee  and moderate dose sliding scaleACHS        Donnie Mesa NP [facility] Cross Cover 7pm-7am - check amion for availability Pager [phone]

## 2023-12-14 NOTE — H&P (Addendum)
History and Physical    Patient: Ryan Hicks:[id number] DOB: 1951/04/19 DOA: 12/14/2023 DOS: the patient was seen and examined on 12/14/2023 PCP: Gracelyn Nurse, MD  Patient coming from: Home  Chief Complaint:  Chief Complaint  Patient presents with   Failure To Thrive   HPI: Ryan Hicks is a 73 y.o. male with medical history significant of advanced dementia, HLD, prior CVA, heterozygous factor V leiden mutation. Medical noncompliance presenting w/ DKA, fall. Limited history in setting of dementia and DKA. Histroy primarily from patient's wife  Ryan Hicks.  Per report, patient with recurrent falls over the past 4 to 5 days while patient and family were out of town in [place].  Had to return home because of falls. Also with decreased p.o. intake.  Wife reports patient being noncompliant with all medications in the setting of advanced dementia.  Intermittently taking glipizide as well as Aricept.  Has had decreased p.o. intake over similar timeframe.  Patient has had difficulty with regular eating.  No reports of fevers or chills.  No reported abdominal pain.  Had episode of emesis yesterday that was noted by wife after patient fell asleep in the recliner.  There is a large amount of dark emesis on the floor after drinking prune juice earlier in the day.  Does not check blood sugars on a regular basis.  Was evaluated by PCP within the past few days because of recent falls.  Imaging grossly stable. Presented to the ER afebrile, hemodynamically stable.  Satting well on room air.  White count 12.8, hemoglobin 16.6, platelets 325, blood sugar into the 400s.  Beta hydroxybutyrate of 6.5.  VBG with a pH of 7.24.  Bicarb 14.  Creatinine 1.2.  T. bili 2.  Urinalysis pending.  CT head and chest x-ray grossly stable. Review of Systems: As mentioned in the history of present illness. All other systems reviewed and are negative. Past Medical History:  Diagnosis Date   Diabetes mellitus without complication  (HCC)    Enlarged prostate with lower urinary tract symptoms (LUTS)    Heterozygous factor V Leiden mutation (HCC)    History of dysplastic nevus 08/20/2017   spinal upper back, mild atypia, limited margins free   Hyperlipidemia    Nephrolithiasis    Pseudophakia of both eyes    Stroke [facility])    Testicular hypofunction    Vertebral artery occlusion    Past Surgical History:  Procedure Laterality Date   BACK SURGERY     COLONOSCOPY WITH PROPOFOL N/A 07/23/2018   Procedure: COLONOSCOPY WITH PROPOFOL;  Surgeon: Toledo, Boykin Nearing, MD;  Location: [facility] ENDOSCOPY;  Service: Gastroenterology;  Laterality: N/A;   DENTAL EXAMINATION UNDER ANESTHESIA     TONSILLECTOMY     Social History:  reports that he has never smoked. He has never used smokeless tobacco. He reports that he does not drink alcohol and does not use drugs.  No Known Allergies  History reviewed. No pertinent family history.  Prior to Admission medications   Medication Sig Start Date End Date Taking? Authorizing Provider  amLODipine (NORVASC) 2.5 MG tablet Take by mouth. 08/18/19 08/17/20  [provider]  aspirin EC 81 MG tablet Take by mouth. 11/26/14   [provider]  atorvastatin (LIPITOR) 80 MG tablet Take by mouth. 05/01/19   [provider]  clobetasol (TEMOVATE) 0.05 % external solution  01/22/18   [provider]  Crisaborole (EUCRISA) 2 % OINT Apply twice daily to affected areas on face and  body as needed 12/14/21   Willeen Niece, MD  erythromycin ophthalmic ointment  02/19/20   [provider]  glipiZIDE (GLUCOTROL) 10 MG tablet  02/27/18   [provider]  glucosamine-chondroitin 500-400 MG tablet Take 1 tablet by mouth 3 (three) times daily.    [provider]  hydrocortisone 2.5 % cream Apply topically 2 (two) times daily as needed (Rash). 04/13/20   Willeen Niece, MD  JANUVIA 25 MG tablet  03/01/18   [provider]  ketoconazole (NIZORAL) 2 % shampoo  Use to wash beard 2-3 times weekly, letting sit a few minutes before rinsing. 12/14/21   Willeen Niece, MD  Magnesium 250 MG TABS Take by mouth.    [provider]  metFORMIN (GLUCOPHAGE) 1000 MG tablet  03/08/18   [provider]  metoprolol succinate (TOPROL-XL) 25 MG 24 hr tablet Take by mouth. 02/19/19 02/19/20  [provider]  metroNIDAZOLE (METROGEL) 1 % gel Apply topically daily. 12/14/21   Willeen Niece, MD  oxiconazole (OXISTAT) 1 % CREA Apply to affected areas as directed as needed 09/07/20   Willeen Niece, MD  pimecrolimus (ELIDEL) 1 % cream Apply to affected areas as directed as needed. 09/07/20   Willeen Niece, MD  selenium sulfide (SELSUN) 2.5 % shampoo Apply as directed to affected areas as needed. 12/14/21   Willeen Niece, MD  Semaglutide,0.25 or 0.5MG /DOS, 2 MG/1.5ML SOPN Inject into the skin. 05/20/20   [provider]  sodium fluoride (PREVIDENT 5000 PLUS) 1.1 % CREA dental cream  04/10/16   [provider]  tamsulosin (FLOMAX) 0.4 MG CAPS capsule Take 1 capsule (0.4 mg total) by mouth daily. 11/22/22   Riki Altes, MD    Physical Exam: Vitals:   12/14/23 1152 12/14/23 1153  BP: (!) 130/98   Pulse: (!) 113   Resp: 17   Temp: 97.6 F (36.4 C)   TempSrc: Oral   SpO2: 98%   Weight:  96.2 kg  Height:  6' (1.829 m)   Physical Exam Constitutional:      Appearance: He is obese.  HENT:     Head: Normocephalic and atraumatic.     Nose: Nose normal.     Mouth/Throat:     Mouth: Mucous membranes are dry.  Eyes:     Pupils: Pupils are equal, round, and reactive to light.  Cardiovascular:     Rate and Rhythm: Normal rate and regular rhythm.  Pulmonary:     Effort: Pulmonary effort is normal.  Abdominal:     General: Bowel sounds are normal.     Tenderness: There is no abdominal tenderness.  Musculoskeletal:        General: Normal range of motion.  Skin:    General: Skin is dry.  Neurological:     General: No focal deficit  present.  Psychiatric:        Mood and Affect: Mood normal.     Data Reviewed:  There are no new results to review at this time.  US Abdomen Limited RUQ (LIVER/GB) CLINICAL DATA:  Hyperbilirubinemia  EXAM: ULTRASOUND ABDOMEN LIMITED RIGHT UPPER QUADRANT  COMPARISON:  None Available.  FINDINGS: Gallbladder:  Distended gallbladder with sludge and stones. Borderline wall thickening of 3 mm. No adjacent fluid. No reported sonographic Murphy's sign.  Common bile duct:  Diameter: 2 mm  Liver:  No focal lesion identified. Within normal limits in parenchymal echogenicity. Portal vein is patent on color Doppler imaging with normal direction of blood flow towards the liver.  Other: None.  IMPRESSION: Distended gallbladder with sludge and stones. Borderline wall thickening. No further sonographic evidence of acute cholecystitis or ductal dilatation.  Electronically Signed   By: Karen Kays M.D.   On: 12/14/2023 16:25 CT Head Wo Contrast CLINICAL DATA:  Weakness.  Fall.  EXAM: CT HEAD WITHOUT CONTRAST  TECHNIQUE: Contiguous axial images were obtained from the base of the skull through the vertex without intravenous contrast.  RADIATION DOSE REDUCTION: This exam was performed according to the departmental dose-optimization program which includes automated exposure control, adjustment of the mA and/or kV according to patient size and/or use of iterative reconstruction technique.  COMPARISON:  CT scan head from 02/13/2012.  FINDINGS: Brain: No evidence of acute infarction, hemorrhage, hydrocephalus, extra-axial collection or mass lesion/mass effect.  There is bilateral periventricular hypodensity, which is non-specific but most likely seen in the settings of microvascular ischemic changes. Mild in extent. Otherwise normal appearance of brain parenchyma.  Cerebral volume loss with enlargement of the ventricles.  Vascular: No hyperdense vessel or unexpected  calcification. Intracranial arteriosclerosis.  Skull: Normal. Negative for fracture or focal lesion.  Sinuses/Orbits: No acute finding.  Other: Visualized mastoid air cells are unremarkable. No mastoid effusion.  IMPRESSION: *No acute intracranial abnormality.  Electronically Signed   By: Jules Schick M.D.   On: 12/14/2023 13:47 DG Chest 2 View CLINICAL DATA:  Weakness.  Multiple falls.  EXAM: CHEST - 2 VIEW  COMPARISON:  None Available.  FINDINGS: Linear area of focal scarring/atelectasis noted overlying the left lateral costophrenic angle. Bilateral lung fields are otherwise clear. Bilateral costophrenic angles are clear.  Normal cardio-mediastinal silhouette.  No acute osseous abnormalities.  The soft tissues are within normal limits.  IMPRESSION: No active cardiopulmonary disease.  Electronically Signed   By: Jules Schick M.D.   On: 12/14/2023 13:43  Lab Results  Component Value Date   WBC 12.8 (H) 12/14/2023   HGB 16.6 12/14/2023   HCT 45.9 12/14/2023   MCV 83.6 12/14/2023   PLT 325 12/14/2023   Last metabolic panel Lab Results  Component Value Date   GLUCOSE 425 (H) 12/14/2023   NA 130 (L) 12/14/2023   K 3.8 12/14/2023   CL 93 (L) 12/14/2023   CO2 14 (L) 12/14/2023   BUN 41 (H) 12/14/2023   CREATININE 1.20 12/14/2023   GFRNONAA >60 12/14/2023   CALCIUM 9.4 12/14/2023   PROT 7.2 12/14/2023   ALBUMIN 3.6 12/14/2023   BILITOT 2.1 (H) 12/14/2023   ALKPHOS 78 12/14/2023   AST 13 (L) 12/14/2023   ALT 12 12/14/2023   ANIONGAP 23 (H) 12/14/2023    Assessment and Plan: * DKA (diabetic ketoacidosis) (HCC) Meeting DKA criteria w/ blood sugar in 400s, bicarb 14, pH 7.24  Started on DKA protocol in ER  Will continue  Clinically dry  Monitor hydration w/ treatment  Transition to long acting and SSI once gap closes and BHB normalizes    Hyperbilirubinemia T. bili 2 on presentation No significant right upper quadrant tenderness palpation on  exam LFTs grossly stable Will check right upper quadrant ultrasound to correlate Monitor  Fall Noted recent recurrent falls at home CT head grossly within normal limits. Suspect DKA and advanced dementia likely confounding issues Nonfocal neuroexam Will monitor for now PT OT evaluation Fall precautions Monitor   Dementia without behavioral disturbance (HCC) Baseline advanced dementia Mild generalized confusion at baseline in setting of DKA  Monitor   Hypertension BP stable  Titrate home regimen      Greater than  50% was spent in counseling and coordination of care with patient Critical care time:  60+ minutes    Advance Care Planning:   Code Status: Full Code   Consults: None   Family Communication: Plan of care discussed w/ wife over the phone   Severity of Illness: The appropriate patient status for this patient is INPATIENT. Inpatient status is judged to be reasonable and necessary in order to provide the required intensity of service to ensure the patient's safety. The patient's presenting symptoms, physical exam findings, and initial radiographic and laboratory data in the context of their chronic comorbidities is felt to place them at high risk for further clinical deterioration. Furthermore, it is not anticipated that the patient will be medically stable for discharge from the hospital within 2 midnights of admission.   * I certify that at the point of admission it is my clinical judgment that the patient will require inpatient hospital care spanning beyond 2 midnights from the point of admission due to high intensity of service, high risk for further deterioration and high frequency of surveillance required.*  Author: Floydene Flock, MD 12/14/2023 3:38 PM  For on call review [email or web address].

## 2023-12-14 NOTE — ED Notes (Signed)
Pts POCT BG 86. Per endo tool, insulin gtt stopped at this time. NP Jon Billings notified.

## 2023-12-14 NOTE — Assessment & Plan Note (Signed)
Noted recent recurrent falls at home CT head grossly within normal limits. Suspect DKA and advanced dementia likely confounding issues Nonfocal neuroexam Will monitor for now PT OT evaluation Fall precautions Monitor

## 2023-12-14 NOTE — ED Notes (Signed)
MD messaged in regard to pt's HR maintaining between 110-130s

## 2023-12-14 NOTE — ED Triage Notes (Signed)
Wife sts that pt has fallen the last couple days. Wife sts that pt has not ate in the last three days. Pt wife sts that pt has not wanted any snack foods either.

## 2023-12-14 NOTE — Assessment & Plan Note (Signed)
BP stable Titrate home regimen

## 2023-12-14 NOTE — Assessment & Plan Note (Signed)
T. bili 2 on presentation No significant right upper quadrant tenderness palpation on exam LFTs grossly stable Will check right upper quadrant ultrasound to correlate Monitor

## 2023-12-14 NOTE — ED Triage Notes (Signed)
Arrives from home via [facility].  C/O multiple falls last wee, and over past week, decrease po intake, weakness, non compliant with meds, blood work from [facility] yesterday -- WBC:  13.  Hx Alzheimers.  Alert to baseline, denies pain. Patient is without complaint  182/104 CBG: 389 102; P 98.7 99% RA ETCO2 26 RR: 16

## 2023-12-14 NOTE — Assessment & Plan Note (Addendum)
Baseline advanced dementia Mild generalized confusion at baseline in setting of DKA  Monitor

## 2023-12-14 NOTE — ED Provider Notes (Signed)
 [facility] Provider Note    Event Date/Time   First MD Initiated Contact with Patient 12/14/23 1443     (approximate)   History   Failure To Thrive   HPI Ryan Hicks is a 73 y.o. male with a history of Alzheimer's and diabetes presenting today for malnutrition.  Patient is here with wife who states that he has been fatigued and weak for the past couple of days.  He has also not been eating or drinking.  Over the past couple weeks he has had several falls but she denies any head or neck injury.  No loss of consciousness.  No change in his mental status.  He was seen at [facility] yesterday and found to have a slightly elevated white blood cell count.  He has no other complaints at this time such as chest pain, shortness of breath, nausea, vomiting, abdominal pain, diarrhea.     Physical Exam   Triage Vital Signs: ED Triage Vitals  Encounter Vitals Group     BP 12/14/23 1152 (!) 130/98     Systolic BP Percentile --      Diastolic BP Percentile --      Pulse Rate 12/14/23 1152 (!) 113     Resp 12/14/23 1152 17     Temp 12/14/23 1152 97.6 F (36.4 C)     Temp Source 12/14/23 1152 Oral     SpO2 12/14/23 1152 98 %     Weight 12/14/23 1153 212 lb (96.2 kg)     Height 12/14/23 1153 6' (1.829 m)     Head Circumference --      Peak Flow --      Pain Score 12/14/23 1153 4     Pain Loc --      Pain Education --      Exclude from Growth Chart --     Most recent vital signs: Vitals:   12/14/23 1152  BP: (!) 130/98  Pulse: (!) 113  Resp: 17  Temp: 97.6 F (36.4 C)  SpO2: 98%   Physical Exam: I have reviewed the vital signs and nursing notes. General: Awake, alert, no acute distress.  Fatigued appearing. Head:  Atraumatic, normocephalic.   ENT:  EOM intact, PERRL. Oral mucosa is pink and moist with no lesions. Neck: Neck is supple with full range of motion, No meningeal signs. Cardiovascular:  RRR, No murmurs. Peripheral pulses palpable and  equal bilaterally. Respiratory:  Symmetrical chest wall expansion.  No rhonchi, rales, or wheezes.  Good air movement throughout.  No use of accessory muscles.   Musculoskeletal:  No cyanosis or edema. Moving extremities with full ROM Abdomen:  Soft, nontender, nondistended. Neuro:  GCS 15, moving all four extremities, interacting appropriately. Speech clear. Psych:  Calm, appropriate.   Skin:  Warm, dry, no rash.    ED Results / Procedures / Treatments   Labs (all labs ordered are listed, but only abnormal results are displayed) Labs Reviewed  CBC - Abnormal; Notable for the following components:      Result Value   WBC 12.8 (*)    MCHC 36.2 (*)    All other components within normal limits  COMPREHENSIVE METABOLIC PANEL - Abnormal; Notable for the following components:   Sodium 130 (*)    Chloride 93 (*)    CO2 14 (*)    Glucose, Bld 425 (*)    BUN 41 (*)    AST 13 (*)    Total Bilirubin 2.1 (*)  Anion gap 23 (*)    All other components within normal limits  BLOOD GAS, VENOUS - Abnormal; Notable for the following components:   pH, Ven 7.24 (*)    pCO2, Ven 37 (*)    Bicarbonate 15.9 (*)    Acid-base deficit 10.7 (*)    All other components within normal limits  URINALYSIS, ROUTINE W REFLEX MICROSCOPIC  BETA-HYDROXYBUTYRIC ACID  BASIC METABOLIC PANEL  BASIC METABOLIC PANEL  BASIC METABOLIC PANEL  BASIC METABOLIC PANEL  BETA-HYDROXYBUTYRIC ACID  BETA-HYDROXYBUTYRIC ACID  BETA-HYDROXYBUTYRIC ACID  BETA-HYDROXYBUTYRIC ACID  HEMOGLOBIN A1C  CBG MONITORING, ED     EKG    RADIOLOGY    PROCEDURES:  Critical Care performed: Yes, see critical care procedure note(s)  .Critical Care  Performed by: Janith Lima, MD Authorized by: Janith Lima, MD   Critical care provider statement:    Critical care time (minutes):  30   Critical care was necessary to treat or prevent imminent or life-threatening deterioration of the following conditions:  Metabolic crisis  (DKA)   Critical care was time spent personally by me on the following activities:  Development of treatment plan with patient or surrogate, discussions with consultants, evaluation of patient's response to treatment, examination of patient, ordering and review of laboratory studies, ordering and review of radiographic studies, ordering and performing treatments and interventions, pulse oximetry, re-evaluation of patient's condition and review of old charts   I assumed direction of critical care for this patient from another provider in my specialty: no     Care discussed with: admitting provider      MEDICATIONS ORDERED IN ED: Medications  lactated ringers bolus 1,924 mL (has no administration in time range)  lactated ringers infusion (has no administration in time range)  dextrose 5 % in lactated ringers infusion (has no administration in time range)  dextrose 50 % solution 0-50 mL (has no administration in time range)  enoxaparin (LOVENOX) injection 40 mg (has no administration in time range)  ondansetron (ZOFRAN) tablet 4 mg (has no administration in time range)    Or  ondansetron (ZOFRAN) injection 4 mg (has no administration in time range)  acetaminophen (TYLENOL) tablet 650 mg (has no administration in time range)    Or  acetaminophen (TYLENOL) suppository 650 mg (has no administration in time range)  lactated ringers bolus 1,924 mL (has no administration in time range)  insulin regular, human (MYXREDLIN) 100 units/ 100 mL infusion (has no administration in time range)  lactated ringers infusion (has no administration in time range)  dextrose 5 % in lactated ringers infusion (has no administration in time range)  potassium chloride 10 mEq in 100 mL IVPB (has no administration in time range)     IMPRESSION / MDM / ASSESSMENT AND PLAN / ED COURSE  I reviewed the triage vital signs and the nursing notes.                              Differential diagnosis includes, but is not  limited to, DKA, hyperglycemia, failure to thrive, electrolyte abnormality, dehydration  Patient's presentation is most consistent with acute presentation with potential threat to life or bodily function.  Patient is a 73 year old male presenting today for fatigue, weakness, decreased p.o. intake x 3 days.  Vital signs are stable on arrival except for slight tachycardia.  Laboratory workup indicative of DKA with anion gap of 23 and glucose of 425.  Patient was started  on DKA protocol.  Ketones present in the urine.  Patient admitted to hospitalist for ongoing care of his DKA.  The patient is on the cardiac monitor to evaluate for evidence of arrhythmia and/or significant heart rate changes.     FINAL CLINICAL IMPRESSION(S) / ED DIAGNOSES   Final diagnoses:  Diabetic ketoacidosis without coma associated with type 2 diabetes mellitus (HCC)     Rx / DC Orders   ED Discharge Orders     None        Note:  This document was prepared using Dragon voice recognition software and may include unintentional dictation errors.   Janith Lima, MD 12/15/23 [phone]

## 2023-12-14 NOTE — ED Notes (Signed)
CCMD called and pt put on cardiac monitoring.

## 2023-12-14 NOTE — ED Provider Triage Note (Signed)
Emergency Medicine Provider Triage Evaluation Note  Ryan Hicks , a 73 y.o. male  was evaluated in triage.  Pt complains of multiple falls.  Over the past week fallen 3 times no head injury no LOC does have generalized pain did have back pain already evaluated by primary..  Review of Systems  Positive: Fall, fatigue Negative: Fever   Physical Exam  BP (!) 130/98 (BP Location: Left Arm)   Pulse (!) 113   Temp 97.6 F (36.4 C) (Oral)   Resp 17   Ht 6' (1.829 m)   Wt 96.2 kg   SpO2 98%   BMI 28.75 kg/m  Gen:   Awake, no distress   Resp:  Normal effort  MSK:   Moves extremities without difficulty  Other:    Medical Decision Making  Medically screening exam initiated at 11:59 AM.  Appropriate orders placed.  Beni Turrell Kijowski was informed that the remainder of the evaluation will be completed by another provider, this initial triage assessment does not replace that evaluation, and the importance of remaining in the ED until their evaluation is complete.  Fatigue workup with head ct   Christen Bame, PA-C 12/14/23 1201

## 2023-12-14 NOTE — Assessment & Plan Note (Signed)
Meeting DKA criteria w/ blood sugar in 400s, bicarb 14, pH 7.24  Started on DKA protocol in ER  Will continue  Clinically dry  Monitor hydration w/ treatment  Transition to long acting and SSI once gap closes and BHB normalizes

## 2023-12-15 ENCOUNTER — Observation Stay: Payer: Medicare PPO

## 2023-12-15 DIAGNOSIS — E1110 Type 2 diabetes mellitus with ketoacidosis without coma: Secondary | ICD-10-CM | POA: Diagnosis not present

## 2023-12-15 LAB — BASIC METABOLIC PANEL
Anion gap: 11 (ref 5–15)
Anion gap: 17 — ABNORMAL HIGH (ref 5–15)
Anion gap: 6 (ref 5–15)
Anion gap: 9 (ref 5–15)
BUN: 27 mg/dL — ABNORMAL HIGH (ref 8–23)
BUN: 28 mg/dL — ABNORMAL HIGH (ref 8–23)
BUN: 33 mg/dL — ABNORMAL HIGH (ref 8–23)
BUN: 34 mg/dL — ABNORMAL HIGH (ref 8–23)
CO2: 16 mmol/L — ABNORMAL LOW (ref 22–32)
CO2: 21 mmol/L — ABNORMAL LOW (ref 22–32)
CO2: 22 mmol/L (ref 22–32)
CO2: 24 mmol/L (ref 22–32)
Calcium: 8 mg/dL — ABNORMAL LOW (ref 8.9–10.3)
Calcium: 8.2 mg/dL — ABNORMAL LOW (ref 8.9–10.3)
Calcium: 8.4 mg/dL — ABNORMAL LOW (ref 8.9–10.3)
Calcium: 8.6 mg/dL — ABNORMAL LOW (ref 8.9–10.3)
Chloride: 100 mmol/L (ref 98–111)
Chloride: 101 mmol/L (ref 98–111)
Chloride: 104 mmol/L (ref 98–111)
Chloride: 105 mmol/L (ref 98–111)
Creatinine, Ser: 0.9 mg/dL (ref 0.61–1.24)
Creatinine, Ser: 0.95 mg/dL (ref 0.61–1.24)
Creatinine, Ser: 0.96 mg/dL (ref 0.61–1.24)
Creatinine, Ser: 0.98 mg/dL (ref 0.61–1.24)
GFR, Estimated: >60 mL/min (ref >60)
GFR, Estimated: >60 mL/min (ref >60)
GFR, Estimated: >60 mL/min (ref >60)
GFR, Estimated: >60 mL/min (ref >60)
Glucose, Bld: 1001 mg/dL (ref 70–99)
Glucose, Bld: 139 mg/dL — ABNORMAL HIGH (ref 70–99)
Glucose, Bld: 156 mg/dL — ABNORMAL HIGH (ref 70–99)
Glucose, Bld: 210 mg/dL — ABNORMAL HIGH (ref 70–99)
Potassium: 3.4 mmol/L — ABNORMAL LOW (ref 3.5–5.1)
Potassium: 3.5 mmol/L (ref 3.5–5.1)
Potassium: 3.6 mmol/L (ref 3.5–5.1)
Potassium: 3.9 mmol/L (ref 3.5–5.1)
Sodium: 133 mmol/L — ABNORMAL LOW (ref 135–145)
Sodium: 134 mmol/L — ABNORMAL LOW (ref 135–145)
Sodium: 134 mmol/L — ABNORMAL LOW (ref 135–145)
Sodium: 135 mmol/L (ref 135–145)

## 2023-12-15 LAB — CBC
HCT: 36.4 % — ABNORMAL LOW (ref 39.0–52.0)
Hemoglobin: 13.3 g/dL (ref 13.0–17.0)
MCH: 30.2 pg (ref 26.0–34.0)
MCHC: 36.5 g/dL — ABNORMAL HIGH (ref 30.0–36.0)
MCV: 82.7 fL (ref 80.0–100.0)
Platelets: 252 10*3/uL (ref 150–400)
RBC: 4.4 MIL/uL (ref 4.22–5.81)
RDW: 11.9 % (ref 11.5–15.5)
WBC: 9.9 10*3/uL (ref 4.0–10.5)
nRBC: 0 % (ref 0.0–0.2)

## 2023-12-15 LAB — CBG MONITORING, ED
Glucose-Capillary: 126 mg/dL — ABNORMAL HIGH (ref 70–99)
Glucose-Capillary: 160 mg/dL — ABNORMAL HIGH (ref 70–99)
Glucose-Capillary: 164 mg/dL — ABNORMAL HIGH (ref 70–99)
Glucose-Capillary: 167 mg/dL — ABNORMAL HIGH (ref 70–99)
Glucose-Capillary: 169 mg/dL — ABNORMAL HIGH (ref 70–99)
Glucose-Capillary: 170 mg/dL — ABNORMAL HIGH (ref 70–99)
Glucose-Capillary: 193 mg/dL — ABNORMAL HIGH (ref 70–99)
Glucose-Capillary: 202 mg/dL — ABNORMAL HIGH (ref 70–99)

## 2023-12-15 LAB — BETA-HYDROXYBUTYRIC ACID
Beta-Hydroxybutyric Acid: 1.98 mmol/L — ABNORMAL HIGH (ref 0.05–0.27)
Beta-Hydroxybutyric Acid: 1.62 mmol/L — ABNORMAL HIGH (ref 0.05–0.27)
Beta-Hydroxybutyric Acid: 0.66 mmol/L — ABNORMAL HIGH (ref 0.05–0.27)

## 2023-12-15 LAB — GLUCOSE, CAPILLARY: Glucose-Capillary: 213 mg/dL — ABNORMAL HIGH (ref 70–99)

## 2023-12-15 MED ORDER — TRAMADOL HCL 50 MG PO TABS
50.0000 mg | ORAL_TABLET | Freq: Four times a day (QID) | ORAL | Status: DC | PRN
  Administered 2023-12-15 – 2023-12-22 (×9): 50 mg via ORAL
  Filled 2023-12-15 (×11): qty 1

## 2023-12-15 MED ORDER — INSULIN GLARGINE-YFGN 100 UNIT/ML ~~LOC~~ SOLN
10.0000 [IU] | Freq: Every day | SUBCUTANEOUS | Status: DC
  Administered 2023-12-15 – 2023-12-16 (×2): 10 [IU] via SUBCUTANEOUS
  Filled 2023-12-15 (×2): qty 0.1

## 2023-12-15 MED ORDER — INSULIN ASPART 100 UNIT/ML IJ SOLN
0.0000 [IU] | Freq: Three times a day (TID) | INTRAMUSCULAR | Status: DC
Start: 2023-12-15 — End: 2023-12-24
  Administered 2023-12-15: 3 [IU] via SUBCUTANEOUS
  Administered 2023-12-15 – 2023-12-16 (×4): 5 [IU] via SUBCUTANEOUS
  Administered 2023-12-16: 3 [IU] via SUBCUTANEOUS
  Administered 2023-12-16: 8 [IU] via SUBCUTANEOUS
  Administered 2023-12-17: 3 [IU] via SUBCUTANEOUS
  Administered 2023-12-17 (×2): 5 [IU] via SUBCUTANEOUS
  Administered 2023-12-17: 3 [IU] via SUBCUTANEOUS
  Administered 2023-12-18: 8 [IU] via SUBCUTANEOUS
  Administered 2023-12-18: 5 [IU] via SUBCUTANEOUS
  Administered 2023-12-18 – 2023-12-19 (×5): 3 [IU] via SUBCUTANEOUS
  Administered 2023-12-19: 5 [IU] via SUBCUTANEOUS
  Administered 2023-12-20 (×2): 3 [IU] via SUBCUTANEOUS
  Administered 2023-12-20: 2 [IU] via SUBCUTANEOUS
  Administered 2023-12-21 (×2): 3 [IU] via SUBCUTANEOUS
  Administered 2023-12-21: 2 [IU] via SUBCUTANEOUS
  Administered 2023-12-21: 3 [IU] via SUBCUTANEOUS
  Administered 2023-12-22: 6 [IU] via SUBCUTANEOUS
  Administered 2023-12-22: 5 [IU] via SUBCUTANEOUS
  Administered 2023-12-22: 3 [IU] via SUBCUTANEOUS
  Administered 2023-12-22: 2 [IU] via SUBCUTANEOUS
  Administered 2023-12-23: 3 [IU] via SUBCUTANEOUS
  Administered 2023-12-23: 8 [IU] via SUBCUTANEOUS
  Administered 2023-12-23: 3 [IU] via SUBCUTANEOUS
  Administered 2023-12-24: 8 [IU] via SUBCUTANEOUS
  Filled 2023-12-15 (×34): qty 1

## 2023-12-15 MED ORDER — PIOGLITAZONE HCL 15 MG PO TABS
15.0000 mg | ORAL_TABLET | Freq: Every day | ORAL | Status: DC
  Administered 2023-12-15 – 2023-12-17 (×3): 15 mg via ORAL
  Filled 2023-12-15 (×3): qty 1

## 2023-12-15 MED ORDER — TAMSULOSIN HCL 0.4 MG PO CAPS
0.4000 mg | ORAL_CAPSULE | Freq: Every day | ORAL | Status: DC
  Administered 2023-12-15 – 2023-12-24 (×10): 0.4 mg via ORAL
  Filled 2023-12-15 (×10): qty 1

## 2023-12-15 MED ORDER — DONEPEZIL HCL 5 MG PO TABS
10.0000 mg | ORAL_TABLET | Freq: Every day | ORAL | Status: DC
  Administered 2023-12-15 – 2023-12-24 (×10): 10 mg via ORAL
  Filled 2023-12-15 (×10): qty 2

## 2023-12-15 MED ORDER — POTASSIUM CHLORIDE CRYS ER 20 MEQ PO TBCR
40.0000 meq | EXTENDED_RELEASE_TABLET | Freq: Once | ORAL | Status: AC
  Administered 2023-12-15: 40 meq via ORAL
  Filled 2023-12-15: qty 2

## 2023-12-15 MED ORDER — INSULIN GLARGINE-YFGN 100 UNIT/ML ~~LOC~~ SOLN: 10.0000 [IU] | Freq: Every day | SUBCUTANEOUS | Status: DC

## 2023-12-15 MED ORDER — LACTATED RINGERS IV SOLN: INTRAVENOUS | Status: AC

## 2023-12-15 NOTE — ED Notes (Signed)
 POCT BG 170. Per endo tool, insulin rate changed to 3.8 units/hr at this time. NP Jon Billings notified.

## 2023-12-15 NOTE — Care Management CC44 (Signed)
 Condition Code 44 Documentation Completed  Patient Details  Name: Ryan Hicks MRN: [id number] Date of Birth: [birth year]   Condition Code 44 given:  Yes Patient signature on Condition Code 44 notice:  Yes Documentation of 2 MD's agreement:  Yes Code 44 added to claim:  Yes    Susa Simmonds, LCSWA 12/15/2023, 12:09 PM

## 2023-12-15 NOTE — ED Notes (Signed)
 Per endo tool - POCT BG 130 - restarted insulin at 2.4 units/hr. NP Jon Billings notified.

## 2023-12-15 NOTE — ED Notes (Signed)
 This tech informed Tiffany, RN of pt's CBG of 202 mg/dL.

## 2023-12-15 NOTE — ED Notes (Signed)
 2nd floor called and notified patient will be brought up as they have been assigned a bed for 20+ minutes.

## 2023-12-15 NOTE — Care Management Obs Status (Signed)
 MEDICARE OBSERVATION STATUS NOTIFICATION   Patient Details  Name: Ryan Hicks MRN: [id number] Date of Birth: 04/25/1951   Medicare Observation Status Notification Given:  Yes    Susa Simmonds, LCSWA 12/15/2023, 12:10 PM

## 2023-12-15 NOTE — ED Notes (Signed)
 Lab called and pts blood glucose was 1001. This RN took POCT BG and it was 126. Per the endo tool, rate of the insulin adjusted to 1.4units/hr. NP Jon Billings notified of different glucose readings.

## 2023-12-15 NOTE — Evaluation (Signed)
 Physical Therapy Evaluation Patient Details Name: Ryan Hicks MRN: [id number] DOB: 1951/05/17 Today's Date: 12/15/2023  History of Present Illness  Pt is a 73 y/o M admitted on 12/14/23 after presenting with c/o DKA & fall. PMH: advanced dementia, HLD, CVA, heterozygous factor V leiden mutation, medical noncompliance  Clinical Impression  Pt seen for PT evaluation with pt agreeable to tx, son present in room, wife on speaker phone throughout session until she arrived. Pt with hx of dementia but oriented to self, time & somewhat situation. Prior to admission pt was living with wife in a multilevel home with bed/bath on 2nd level & 1 step without rails to get inside. Pt was independent without AD until recently when he started falling & pt began using RW & requiring up to +2 assist for mobility. On this date, pt requires mod assist for bed mobility, min assist for STS from elevated surface, & min assist to ambulate short distance with RW. Pt would benefit from ongoing PT services to address mobility & balance.  Of note, pt reports 4/10 back pain. Family reports pt had imaging done at [facility] on 12/13/23 but unable to see in chart -- MD made aware & reports he will order imaging for pt's back.        If plan is discharge home, recommend the following: Assist for transportation;A lot of help with walking and/or transfers;A lot of help with bathing/dressing/bathroom;Direct supervision/assist for financial management;Supervision due to cognitive status;Assistance with cooking/housework;Direct supervision/assist for medications management;Help with stairs or ramp for entrance   Can travel by private vehicle   No    Equipment Recommendations Other (comment) (defer to next venue)  Recommendations for Other Services       Functional Status Assessment Patient has had a recent decline in their functional status and demonstrates the ability to make significant improvements in function in a  reasonable and predictable amount of time.     Precautions / Restrictions Precautions Precautions: Fall Restrictions Weight Bearing Restrictions Per Provider Order: No      Mobility  Bed Mobility Overal bed mobility: Needs Assistance Bed Mobility: Rolling, Sidelying to Sit, Sit to Supine Rolling: Min assist Sidelying to sit: Mod assist, HOB elevated   Sit to supine: Mod assist (to elevate BLE onto bed, pt limited 2/2 back pain)   General bed mobility comments: cuing for log rolling for supine>sit    Transfers Overall transfer level: Needs assistance Equipment used: Rolling walker (2 wheels) Transfers: Sit to/from Stand Sit to Stand: Min assist, From elevated surface           General transfer comment: STS from elevated stretcher bed x 2, cuing re: hand placement during sit>stand with good return demo during sit>stand but poor return demo during stand>sit    Ambulation/Gait Ambulation/Gait assistance: Min Chemical engineer (Feet): 40 Feet Assistive device: Rolling walker (2 wheels) Gait Pattern/deviations: Decreased step length - right, Decreased step length - left, Decreased stride length, Step-through pattern, Trunk flexed Gait velocity: decreased     General Gait Details: Pt pushing RW out in front of him with poor ability to ambulate within base of AD despite ongoing cuing. Unable to fully stand upright, pt with c/o back pain.  Stairs            Wheelchair Mobility     Tilt Bed    Modified Rankin (Stroke Patients Only)       Balance Overall balance assessment: Needs assistance, History of Falls Sitting-balance support: Feet supported  Sitting balance-Leahy Scale: Fair Sitting balance - Comments: close supervision<>CGA static sitting EOB   Standing balance support: Bilateral upper extremity supported, Reliant on assistive device for balance, During functional activity Standing balance-Leahy Scale: Poor                                Pertinent Vitals/Pain Pain Assessment Pain Assessment: 0-10 Pain Score: 4  Pain Location: low back Pain Descriptors / Indicators: Discomfort, Grimacing, Guarding Pain Intervention(s): Monitored during session, Limited activity within patient's tolerance, Repositioned    Home Living Family/patient expects to be discharged to:: Private residence Living Arrangements: Spouse/significant other Available Help at Discharge: Family;Available 24 hours/day Type of Home: House Home Access: Stairs to enter Entrance Stairs-Rails: None Entrance Stairs-Number of Steps: 1 Alternate Level Stairs-Number of Steps: flight Home Layout: Two level;Bed/bath upstairs Home Equipment: Agricultural consultant (2 wheels)      Prior Function               Mobility Comments: Prior to this week pt was ambulatory without AD but pt has experienced 3 falls in the past ~10 days & now requiring up to 2 person assist at home for transfers & gait; pt also started using RW after falls. No longer driving. ADLs Comments: Independent with bathing, dressing prior to falls leading to admission. "Allowed" his wife to manage 3 of his medications vs all of them.     Extremity/Trunk Assessment   Upper Extremity Assessment Upper Extremity Assessment: Overall WFL for tasks assessed    Lower Extremity Assessment Lower Extremity Assessment: Generalized weakness       Communication        Cognition Arousal: Alert Behavior During Therapy: WFL for tasks assessed/performed   PT - Cognitive impairments: History of cognitive impairments, Orientation, Awareness, Memory, Problem solving, Safety/Judgement   Orientation impairments: Place (oriented to [facility] but not hospital)                   PT - Cognition Comments: Pt with hx of dementia. Following commands: Impaired Following commands impaired: Only follows one step commands consistently, Follows one step commands with increased time     Cueing        General Comments      Exercises     Assessment/Plan    PT Assessment Patient needs continued PT services  PT Problem List Decreased strength;Pain;Decreased range of motion;Decreased cognition;Decreased activity tolerance;Decreased balance;Decreased mobility;Decreased safety awareness;Decreased knowledge of use of DME       PT Treatment Interventions DME instruction;Gait training;Neuromuscular re-education;Stair training;Cognitive remediation;Patient/family education;Functional mobility training;Manual techniques;Therapeutic exercise;Therapeutic activities;Modalities;Balance training    PT Goals (Current goals can be found in the Care Plan section)  Acute Rehab PT Goals Patient Stated Goal: get better, decreased pain PT Goal Formulation: With patient/family Time For Goal Achievement: 12/29/23 Potential to Achieve Goals: Good    Frequency Min 1X/week     Co-evaluation               AM-PAC PT "6 Clicks" Mobility  Outcome Measure Help needed turning from your back to your side while in a flat bed without using bedrails?: A Little Help needed moving from lying on your back to sitting on the side of a flat bed without using bedrails?: A Lot Help needed moving to and from a bed to a chair (including a wheelchair)?: A Lot Help needed standing up from a chair using your arms (e.g., wheelchair or bedside  chair)?: A Lot Help needed to walk in hospital room?: A Lot Help needed climbing 3-5 steps with a railing? : A Lot 6 Click Score: 13    End of Session   Activity Tolerance: Patient tolerated treatment well Patient left: in bed;with bed alarm set;with call bell/phone within reach;with family/visitor present Nurse Communication: Mobility status PT Visit Diagnosis: Pain;Muscle weakness (generalized) (M62.81);Other abnormalities of gait and mobility (R26.89);Unsteadiness on feet (R26.81);History of falling (Z91.81);Difficulty in walking, not elsewhere classified (R26.2) Pain -  part of body:  (back)    Time: 1610-9604 PT Time Calculation (min) (ACUTE ONLY): 46 min   Charges:   PT Evaluation $PT Eval Moderate Complexity: 1 Mod PT Treatments $Therapeutic Activity: 8-22 mins PT General Charges $$ ACUTE PT VISIT: 1 Visit         Aleda Grana, PT, DPT 12/15/23, 11:22 AM   Sandi Mariscal 12/15/2023, 11:20 AM

## 2023-12-15 NOTE — Progress Notes (Signed)
 Progress Note   Patient: Ryan Hicks MWU:[id number] DOB: [birth year] DOA: 12/14/2023     1 DOS: the patient was seen and examined on 12/15/2023   Brief hospital course: From HPI "MEIR ELWOOD is a 73 y.o. male with medical history significant of advanced dementia, HLD, prior CVA, heterozygous factor V leiden mutation. Medical noncompliance presenting w/ DKA, fall. Limited history in setting of dementia and DKA. Histroy primarily from patient's wife  Joy Colegrove.  Per report, patient with recurrent falls over the past 4 to 5 days while patient and family were out of town in [place].  Had to return home because of falls. Also with decreased p.o. intake.  Wife reports patient being noncompliant with all medications in the setting of advanced dementia.  Intermittently taking glipizide as well as Aricept.  Has had decreased p.o. intake over similar timeframe.  Patient has had difficulty with regular eating.  No reports of fevers or chills.  No reported abdominal pain.  Had episode of emesis yesterday that was noted by wife after patient fell asleep in the recliner.  There is a large amount of dark emesis on the floor after drinking prune juice earlier in the day.  Does not check blood sugars on a regular basis.  Was evaluated by PCP within the past few days because of recent falls.  Imaging grossly stable. Presented to the ER afebrile, hemodynamically stable.  Satting well on room air.  White count 12.8, hemoglobin 16.6, platelets 325, blood sugar into the 400s.  Beta hydroxybutyrate of 6.5.  VBG with a pH of 7.24.  Bicarb 14.  Patient was found to be in DKA on presentation and hospitalist service was contacted to admit patient for further management  Assessment and Plan:  * DKA (diabetic ketoacidosis) (HCC) Meeting DKA criteria w/ blood sugar in 400s, bicarb 14, pH 7.24  Have completed IV insulin Currently on subcutaneous insulin Continue carb controlled diet Monitor glucose closely      Hyperbilirubinemia Ultrasound did not show any evidence of cholecystitis   Fall Noted recent recurrent falls at home CT head grossly within normal limits. PT OT on board and recommending acute rehab     Dementia without behavioral disturbance (HCC) Continue home medications   Hypertension Monitor blood pressure Continue home medications    Advance Care Planning:   Code Status: Full Code    Consults: None    Family Communication: Plan of care discussed w/ wife over the phone    Subjective:  Patient seen and examined at bedside this morning Denies nausea vomiting abdominal pain Currently out of DKA Was seen by PT OT with recommendation for acute rehab  Physical Exam:  General: Not in any acute distress HENT:     Head: Normocephalic and atraumatic.     Nose: Nose normal.     Mouth/Throat:     Mouth: Mucous membranes are dry.  Eyes:     Pupils: Pupils are equal, round, and reactive to light.  Cardiovascular:     Rate and Rhythm: Normal rate and regular rhythm.  Pulmonary:     Effort: Pulmonary effort is normal.  Abdominal:     General: Bowel sounds are normal.     Tenderness: There is no abdominal tenderness.  Musculoskeletal:        General: Normal range of motion.  Skin:    General: Skin is dry.  Neurological:     General: No focal deficit present.  Psychiatric:        Mood  and Affect: Mood normal.  Vitals:   12/15/23 0500 12/15/23 0600 12/15/23 0800 12/15/23 0900  BP: (!) 150/88 (!) 166/93 (!) 122/96 (!) 162/99  Pulse: 89 91 85 85  Resp: 18 18 20 18   Temp: 98.7 F (37.1 C)   98.4 F (36.9 C)  TempSrc: Oral     SpO2: 100% 98% 100%   Weight:      Height:        Data Reviewed: I have reviewed patient's ultrasound of the abdomen that did not show any findings of acute cholecystitis    Latest Ref Rng & Units 12/15/2023   11:11 AM 12/15/2023    3:37 AM 12/15/2023    1:16 AM  BMP  Glucose 70 - 99 mg/dL 409  811  914   BUN 8 - 23 mg/dL 27  33  34    Creatinine 0.61 - 1.24 mg/dL 7.82  9.56  2.13   Sodium 135 - 145 mmol/L 135  134  134   Potassium 3.5 - 5.1 mmol/L 3.5  3.9  3.6   Chloride 98 - 111 mmol/L 105  104  101   CO2 22 - 32 mmol/L 21  24  22    Calcium 8.9 - 10.3 mg/dL 8.2  8.6  8.4        Latest Ref Rng & Units 12/15/2023    3:37 AM 12/14/2023   11:55 AM 02/13/2012    3:56 PM  CBC  WBC 4.0 - 10.5 K/uL 9.9  12.8  8.3   Hemoglobin 13.0 - 17.0 g/dL 08.6  57.8  46.9   Hematocrit 39.0 - 52.0 % 36.4  45.9  41.8   Platelets 150 - 400 K/uL 252  325  191      Author: Loyce Dys, MD 12/15/2023 2:25 PM  For on call review [email or web address].

## 2023-12-16 DIAGNOSIS — E1110 Type 2 diabetes mellitus with ketoacidosis without coma: Secondary | ICD-10-CM | POA: Diagnosis not present

## 2023-12-16 LAB — CBC WITH DIFFERENTIAL/PLATELET
Abs Immature Granulocytes: 0.02 10*3/uL (ref 0.00–0.07)
Basophils Absolute: 0 10*3/uL (ref 0.0–0.1)
Basophils Relative: 0 %
Eosinophils Absolute: 0.1 10*3/uL (ref 0.0–0.5)
Eosinophils Relative: 1 %
HCT: 37.7 % — ABNORMAL LOW (ref 39.0–52.0)
Hemoglobin: 13.9 g/dL (ref 13.0–17.0)
Immature Granulocytes: 0 %
Lymphocytes Relative: 15 %
Lymphs Abs: 1.1 10*3/uL (ref 0.7–4.0)
MCH: 30.3 pg (ref 26.0–34.0)
MCHC: 36.9 g/dL — ABNORMAL HIGH (ref 30.0–36.0)
MCV: 82.3 fL (ref 80.0–100.0)
Monocytes Absolute: 0.7 10*3/uL (ref 0.1–1.0)
Monocytes Relative: 9 %
Neutro Abs: 5.7 10*3/uL (ref 1.7–7.7)
Neutrophils Relative %: 75 %
Platelets: 189 10*3/uL (ref 150–400)
RBC: 4.58 MIL/uL (ref 4.22–5.81)
RDW: 11.7 % (ref 11.5–15.5)
WBC: 7.6 10*3/uL (ref 4.0–10.5)
nRBC: 0 % (ref 0.0–0.2)

## 2023-12-16 LAB — BASIC METABOLIC PANEL
Anion gap: 10 (ref 5–15)
BUN: 18 mg/dL (ref 8–23)
CO2: 23 mmol/L (ref 22–32)
Calcium: 8.1 mg/dL — ABNORMAL LOW (ref 8.9–10.3)
Chloride: 100 mmol/L (ref 98–111)
Creatinine, Ser: 0.82 mg/dL (ref 0.61–1.24)
GFR, Estimated: >60 mL/min (ref >60)
Glucose, Bld: 226 mg/dL — ABNORMAL HIGH (ref 70–99)
Potassium: 3.3 mmol/L — ABNORMAL LOW (ref 3.5–5.1)
Sodium: 133 mmol/L — ABNORMAL LOW (ref 135–145)

## 2023-12-16 LAB — GLUCOSE, CAPILLARY
Glucose-Capillary: 234 mg/dL — ABNORMAL HIGH (ref 70–99)
Glucose-Capillary: 250 mg/dL — ABNORMAL HIGH (ref 70–99)
Glucose-Capillary: 213 mg/dL — ABNORMAL HIGH (ref 70–99)
Glucose-Capillary: 264 mg/dL — ABNORMAL HIGH (ref 70–99)
Glucose-Capillary: 193 mg/dL — ABNORMAL HIGH (ref 70–99)

## 2023-12-16 LAB — BETA-HYDROXYBUTYRIC ACID
Beta-Hydroxybutyric Acid: 1.87 mmol/L — ABNORMAL HIGH (ref 0.05–0.27)
Beta-Hydroxybutyric Acid: 0.47 mmol/L — ABNORMAL HIGH (ref 0.05–0.27)

## 2023-12-16 MED ORDER — GLUCERNA SHAKE PO LIQD
237.0000 mL | Freq: Two times a day (BID) | ORAL | Status: DC
  Administered 2023-12-17 – 2023-12-24 (×10): 237 mL via ORAL

## 2023-12-16 MED ORDER — POTASSIUM CHLORIDE CRYS ER 20 MEQ PO TBCR
40.0000 meq | EXTENDED_RELEASE_TABLET | ORAL | Status: AC
  Administered 2023-12-16 (×2): 40 meq via ORAL
  Filled 2023-12-16 (×2): qty 2

## 2023-12-16 MED ORDER — ENSURE MAX PROTEIN PO LIQD
11.0000 [oz_av] | Freq: Every day | ORAL | Status: DC
  Administered 2023-12-16 – 2023-12-23 (×8): 11 [oz_av] via ORAL
  Filled 2023-12-16: qty 330

## 2023-12-16 MED ORDER — INSULIN GLARGINE-YFGN 100 UNIT/ML ~~LOC~~ SOLN
15.0000 [IU] | Freq: Every day | SUBCUTANEOUS | Status: DC
  Administered 2023-12-17 – 2023-12-24 (×8): 15 [IU] via SUBCUTANEOUS
  Filled 2023-12-16 (×8): qty 0.15

## 2023-12-16 MED ORDER — ADULT MULTIVITAMIN W/MINERALS CH
1.0000 | ORAL_TABLET | Freq: Every day | ORAL | Status: DC
  Administered 2023-12-16 – 2023-12-24 (×9): 1 via ORAL
  Filled 2023-12-16 (×9): qty 1

## 2023-12-16 NOTE — Inpatient Diabetes Management (Signed)
 Inpatient Diabetes Program Recommendations  AACE/ADA: New Consensus Statement on Inpatient Glycemic Control (2015)  Target Ranges:  Prepandial:   less than 140 mg/dL      Peak postprandial:   less than 180 mg/dL (1-2 hours)      Critically ill patients:  140 - 180 mg/dL   Lab Results  Component Value Date   GLUCAP 250 (H) 12/16/2023   HGBA1C 11.1 (H) 12/14/2023    Review of Glycemic Control  Diabetes history: DM2 Outpatient Diabetes medications: Actos 15  mg QD Current orders for Inpatient glycemic control: Semglee 15 units every day, Novolog 0-15 units TID and HS, Actos 15 mg QD  Noted consult for Diabetes Coordinator. Diabetes Coordinator is not on campus over the weekend but available by pager from 8am to 5pm for questions or concerns. Chart reviewed.  Will continue to follow while inpatient.  Thank you, Dulce Sellar, MSN, CDCES Diabetes Coordinator Inpatient Diabetes Program [phone] (team pager from 8a-5p)

## 2023-12-16 NOTE — TOC Initial Note (Signed)
 Transition of Care [facility]) - Initial/Assessment Note    Patient Details  Name: Ryan Hicks MRN: [id number] Date of Birth: Sep 23, 1951  Transition of Care [facility]) CM/SW Contact:    Liliana Cline, LCSW Phone Number: 12/16/2023, 10:45 AM  Clinical Narrative:                 CSW spoke with patient's wife Joy regarding SNF recommendation. Patient is from home with wife. PCP is Dr. Letitia Libra. [facility] is [facility]. Patient has a RW, wheelchair, and gait belt at home. No SNF history. Joy is agreeable to SNF. She states their top choice is [facility], they want to see if [facility] can take the patient before considering other options. 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 She states she can also reach out to her contacts at [facility] if needed.Provided Medicare Care Compare website info as well for her to review for other SNF options if needed.  CSW left VM for Sue Lush at [facility], awaiting response.   Expected Discharge Plan: Skilled Nursing Facility Barriers to Discharge: Continued Medical Work up   Patient Goals and CMS Choice   CMS Medicare.gov Compare Post Acute Care list provided to:: Patient Choice offered to / list presented to : Patient      Expected Discharge Plan and Services       Living arrangements for the past 2 months: Single Family Home Expected Discharge Date: 12/15/23                                    Prior Living Arrangements/Services Living arrangements for the past 2 months: Single Family Home Lives with:: Spouse Patient language and need for interpreter reviewed:: Yes Do you feel safe going back to the place where you live?: Yes      Need for Family Participation in Patient Care: Yes (Comment) Care giver support system in place?: Yes (comment) Current home services:  DME Criminal Activity/Legal Involvement Pertinent to Current Situation/Hospitalization: No - Comment as needed  Activities of Daily Living      Permission Sought/Granted Permission sought to share information with : Facility Industrial/product designer granted to share information with : Yes, Verbal Permission Granted (by spouse)     Permission granted to share info w AGENCY: SNFs        Emotional Assessment       Orientation: : Fluctuating Orientation (Suspected and/or reported Sundowners) Alcohol / Substance Use: Not Applicable Psych Involvement: No (comment)  Admission diagnosis:  DKA (diabetic ketoacidosis) (HCC) [E11.10] Diabetic ketoacidosis without coma associated with type 2 diabetes mellitus (HCC) [E11.10] Patient Active Problem List   Diagnosis Date Noted   DKA (diabetic ketoacidosis) (HCC) 12/14/2023   Dementia without behavioral disturbance (HCC) 12/14/2023   Fall 12/14/2023   Hyperbilirubinemia 12/14/2023   Erectile dysfunction due to arterial insufficiency 03/31/2019   Rosacea 01/29/2019   Folliculitis 01/29/2019   Eczema 01/29/2019   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) 12/04/2018   BPH with obstruction/lower urinary tract symptoms 04/01/2018   Nephrolithiasis 05/01/2016   History of nephrolithiasis 11/24/2014   S/P cataract extraction and insertion of intraocular lens 08/05/2013   Pseudophakia of right eye 06/23/2013   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 09/02/2012   Glycosuria 09/02/2012   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   Heterozygous factor V Leiden mutation (HCC) 07/12/2012   Diabetes (HCC) 05/24/2012   Hyperlipidemia 02/29/2012   Hypertension 02/29/2012   Stroke (HCC) 02/29/2012   Vertebral artery occlusion 02/29/2012   PCP:  Gracelyn Nurse, MD Pharmacy:   [id number] - [place], [place] - [address] [place] [address] Phone: [phone] Fax:  [phone]     Social Drivers of Health (SDOH) Social History: SDOH Screenings   Food Insecurity: No Food Insecurity (12/15/2023)  Housing: Low Risk  (12/15/2023)  Transportation Needs: No Transportation Needs (12/15/2023)  Utilities: Not At Risk (12/15/2023)  Social Connections: Unknown (12/15/2023)  Tobacco Use: Low Risk  (12/14/2023)  Recent Concern: Tobacco Use - High Risk (12/05/2023)   Received from [facility]   SDOH Interventions:     Readmission Risk Interventions     No data to display

## 2023-12-16 NOTE — Progress Notes (Signed)
 Initial Nutrition Assessment  DOCUMENTATION CODES:   Not applicable  INTERVENTION:   -Continue with carb modified diet -MVI with minerals daily -Glucerna Shake po BID, each supplement provides 220 kcal and 10 grams of protein  -Ensure Max po daily, each supplement provides 150 kcal and 30 grams of protein.    NUTRITION DIAGNOSIS:   Increased nutrient needs related to chronic illness as evidenced by estimated needs.  GOAL:   Patient will meet greater than or equal to 90% of their needs  MONITOR:   PO intake, Supplement acceptance  REASON FOR ASSESSMENT:   Consult Diet education  ASSESSMENT:   Pt with medical history significant of advanced dementia, HLD, prior CVA, heterozygous factor V leiden mutation. Medical noncompliance presenting w/ DKA, fall. Limited history in setting of dementia and DKA. Per report, patient with recurrent falls over the past 4 to 5 days PTA while patient and family were out of town in [place] and had to return home because of falls. Also with decreased p.o. intake.  Wife reports patient being noncompliant with all medications in the setting of advanced dementia.  Pt admitted with DKA.   2/16- s/p BSE- regular consistency diet  Reviewed I/O's: +1.4 L x 24 hours and +2.6 L since admission  UOP: 350 ml x 24 hours  Pt unavailable at time of visit. Attempted to speak with pt via call to hospital room phone, however, unable to reach. RD unable to obtain further nutrition-related history or complete nutrition-focused physical exam at this time.    Pt currently on a carb modified diet. No meal completion data available to assess at this time.   Reviewed wt hx; pt has experienced a 7.8% wt loss over the past month, which is significant for time frame. Per nursing notes, pt also with edema, which may be masking true weight loss as well as fat and muscle depletions. Pt is at high risk for malnutrition given history of poor oral intake, medication  noncompliance, and dementia. However, unable to identify malnutrition at this time.   Medications reviewed and include lovenox.  Lab Results  Component Value Date   HGBA1C 11.1 (H) 12/14/2023   PTA DM medications are 15 mg actos daily.   Labs reviewed: Na: 133, K: 3.3, CBGS: 213-250 (inpatient orders for glycemic control are 0-15 units insulin aspart TID before meals and at bedtime, 15 units insulin glargine-yfgn daily, and 15 units pioglitazone daily).    Diet Order:   Diet Order             Diet Carb Modified Fluid consistency: Thin; Room service appropriate? Yes with Assist  Diet effective now                   EDUCATION NEEDS:   No education needs have been identified at this time  Skin:  Skin Assessment: Reviewed RN Assessment  Last BM:  12/13/23  Height:   Ht Readings from Last 1 Encounters:  12/14/23 6' (1.829 m)    Weight:   Wt Readings from Last 1 Encounters:  12/14/23 96.2 kg    Ideal Body Weight:  80.9 kg  BMI:  Body mass index is 28.75 kg/m.  Estimated Nutritional Needs:   Kcal:  2000-2200  Protein:  105-120 grams  Fluid:  > 2 L    Levada Schilling, RD, LDN, CDCES Registered Dietitian III Certified Diabetes Care and Education Specialist If unable to reach this RD, please use "RD Inpatient" group chat on secure chat between hours of  8am-4 pm daily

## 2023-12-16 NOTE — Evaluation (Signed)
 Clinical/Bedside Swallow Evaluation Patient Details  Name: Ryan Hicks MRN: [id number] Date of Birth: 06/10/1951  Today's Date: 12/16/2023 Time: SLP Start Time (ACUTE ONLY): 0850 SLP Stop Time (ACUTE ONLY): 0950 SLP Time Calculation (min) (ACUTE ONLY): 60 min  Past Medical History:  Past Medical History:  Diagnosis Date   Diabetes mellitus without complication (HCC)    Enlarged prostate with lower urinary tract symptoms (LUTS)    Heterozygous factor V Leiden mutation (HCC)    History of dysplastic nevus 08/20/2017   spinal upper back, mild atypia, limited margins free   Hyperlipidemia    Nephrolithiasis    Pseudophakia of both eyes    Stroke [facility])    Testicular hypofunction    Vertebral artery occlusion    Past Surgical History:  Past Surgical History:  Procedure Laterality Date   BACK SURGERY     COLONOSCOPY WITH PROPOFOL N/A 07/23/2018   Procedure: COLONOSCOPY WITH PROPOFOL;  Surgeon: Toledo, Boykin Nearing, MD;  Location: [facility] ENDOSCOPY;  Service: Gastroenterology;  Laterality: N/A;   DENTAL EXAMINATION UNDER ANESTHESIA     TONSILLECTOMY     HPI:  Pt is a 73 y.o. male with medical history significant of advanced Dementia, REFLUX activity and episodes of Regurgitation in the past w/ Pepto Bismal use per pt report, HLD, prior CVA, heterozygous factor V leiden mutation. Medical noncompliance presenting w/ DKA, fall. Limited history in setting of dementia and DKA. History primarily from patient's wife  Ryan Hicks.  Per report, patient with recurrent falls over the past 4 to 5 days while patient and family were out of town in [place].  Had to return home because of falls. Also with decreased p.o. intake.  Wife reports patient being noncompliant with all medications in the setting of advanced dementia.  Intermittently taking glipizide as well as Aricept.  Has had decreased p.o. intake over similar timeframe.  No reports of fevers or chills.  No reported abdominal pain.  Had episode of emesis  yesterday that was noted by wife after patient fell asleep in the recliner.  There is a large amount of dark emesis on the floor after drinking prune juice earlier in the day.  Does not check blood sugars on a regular basis.  Blood sugar at admit: 400s per chart.   CXR at admit: No active cardiopulmonary disease. Last chest imaging was in 2020 - negative.    Assessment / Plan / Recommendation  Clinical Impression   Pt seen for BSE. Pt awake, verbal and followed instructions w/ cue. Distracted at times; tangential. Baseline Dementia. Son present. Pt gave personal history including discussion of episodes of REGURGITATION at home for "many years". Noted hiccups, belching post his breakfast meal items consumed w/ Son prior to visit.  On RA, afebrile. WBC WNL.  Pt appears to present w/ functional oropharyngeal phase swallowing w/ No oropharyngeal phase dysphagia noted, No neuromuscular deficits noted. Pt consumed po trials w/ No overt, clinical s/s of aspiration during po trials.  Pt appears at reduced risk for aspiration/aspiration pneumonia when following general aspiration precautions, and REFLUX precautions. Pt does have challenging factors that could impact oropharyngeal swallowing to include Baseline Dementia, deconditioning/weakness, acute illness, and pt has a Baseline presentation of REFLUX activity and episodes of REFLUX/Regurgitation behavior, hiccups/belching post meals. He is not on a PPI. ANY Esophageal phase Dysmotility or Regurgitation of Reflux material can increase risk for aspiration of the Reflux material during Retrograde flow thus impact Voicing and Pulmonary status. Pt described issues of Regurgitation -- unsure  how current. He endorsed taking "Pepto Bismal".  These factors above can increase risk for dysphagia as well as decreased oral intake overall.   During po trials, pt consumed all consistencies w/ no overt coughing, decline in vocal quality, or change in respiratory presentation  during/post trials. O2 sats remained upper 90s during. Oral phase appeared [facility] w/ timely bolus management, mastication, and control of bolus propulsion for A-P transfer for swallowing. Oral clearing achieved w/ all trial consistencies -- moistened, soft foods given.  OM Exam appeared [facility] w/ no unilateral weakness noted. Speech Clear, intelligible. Pt fed self w/ setup support.   Recommend a Regular/Mech Soft consistency diet (w/ well-Cut meats, foods), moistened foods; Thin liquids -- via Cup recommended to reduce air swallowed. Recommend general aspiration precautions, including less distractions/talking during meals. Recommend REFLUX precautions including no sodas w/ meals d/t carbonated air. Pills WHOLE in Puree for safer, easier swallowing -- encouraged now and for D/C to the pt and Son d/t some c/o difficulty swallowing Pills.  Education given on Pills in Puree; food consistencies and easy to eat options; general aspiration and REFLUX precautions to pt and Son, Handouts given.  No further skilled ST services indicated. Recommend f/u w/ GI for assessment and management. MD/NSG to reconsult if any new needs arise. NSG updated, agreed. MD updated. Recommend Dietician f/u for support. 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 Recommend general aspiration precautions, including less distractions/talking during meals. Recommend REFLUX precautions including no sodas w/ meals d/t carbonated air.   Medication Administration: Whole meds with puree (for safer swallowing)    Other  Recommendations Recommended Consults:  (GI f/u) Oral Care Recommendations: Oral  care BID;Oral care before and after PO;Patient independent with oral care (setup)    Recommendations for follow up therapy are one component of a multi-disciplinary discharge planning process, led by the attending physician.  Recommendations may be updated based on patient status, additional functional criteria and insurance authorization.  Follow up Recommendations No SLP follow up      Assistance Recommended at Discharge  Intermittent d/t Dementia baseline  Functional Status Assessment Patient has had a recent decline in their functional status and demonstrates the ability to make significant improvements in function in a reasonable and predictable amount of time.  Frequency and Duration  (n/a)   (n/a)       Prognosis Prognosis for improved oropharyngeal function: Good Barriers to Reach Goals: Cognitive deficits;Time post onset;Severity of deficits Barriers/Prognosis Comment: Esophageal phase dysmotility      Swallow Study   General Date of Onset: 12/14/23 HPI: Pt is a 73 y.o. male with medical history significant of advanced Dementia, REFLUX activity and episodes of Regurgitation in the past w/ Pepto Bismal use per pt report, HLD, prior CVA, heterozygous factor V leiden mutation. Medical noncompliance presenting w/ DKA, fall. Limited history in setting of dementia and DKA. History primarily from patient's wife  Ryan Hicks.  Per report, patient with recurrent falls over the past 4 to 5 days while patient and family were out of town in [place].  Had to return home because of falls. Also with decreased p.o. intake.  Wife reports patient being noncompliant with all medications in the  setting of advanced dementia.  Intermittently taking glipizide as well as Aricept.  Has had decreased p.o. intake over similar timeframe.  No reports of fevers or chills.  No reported abdominal pain.  Had episode of emesis yesterday that was noted by wife after patient fell asleep in the recliner.  There is a large  amount of dark emesis on the floor after drinking prune juice earlier in the day.  Does not check blood sugars on a regular basis.  Blood sugar at admit: 400s per chart.   CXR at admit: No active cardiopulmonary disease. Last chest imaging was in 2020 - negative. Type of Study: Bedside Swallow Evaluation Previous Swallow Assessment: none Diet Prior to this Study: NPO Temperature Spikes Noted: No (wbc 7.6) Respiratory Status: Room air History of Recent Intubation: No Behavior/Cognition: Alert;Cooperative;Pleasant mood;Confused;Distractible;Requires cueing (baseline Dementia) Oral Cavity Assessment: Within Functional Limits Oral Care Completed by SLP: Yes Oral Cavity - Dentition: Adequate natural dentition Vision: Functional for self-feeding Self-Feeding Abilities: Able to feed self;Needs assist;Needs set up Patient Positioning: Upright in bed (Min+ assist) Baseline Vocal Quality: Normal Volitional Cough: Strong Volitional Swallow: Able to elicit    Oral/Motor/Sensory Function Overall Oral Motor/Sensory Function: Within functional limits   Ice Chips Ice chips: Within functional limits Presentation: Spoon (fed; 3 trials)   Thin Liquid Thin Liquid: Within functional limits Presentation: Cup;Self Fed (~4-5 ozs total)    Nectar Thick Nectar Thick Liquid: Not tested   Honey Thick Honey Thick Liquid: Not tested   Puree Puree: Within functional limits Presentation: Self Fed;Spoon (6 trials)   Solid     Solid: Within functional limits Presentation: Self Fed (5 trials)        Jerilynn Som, MS, CCC-SLP Speech Language Pathologist Rehab Services; [facility] [place] [phone] (ascom) Phill Steck 12/16/2023,2:09 PM

## 2023-12-16 NOTE — Progress Notes (Signed)
 Progress Note   Patient: Ryan Hicks NWG:[id number] DOB: [birth year] DOA: 12/14/2023     1 DOS: the patient was seen and examined on 12/16/2023     Brief hospital course: From HPI "Ryan Hicks is a 73 y.o. male with medical history significant of advanced dementia, HLD, prior CVA, heterozygous factor V leiden mutation. Medical noncompliance presenting w/ DKA, fall. Limited history in setting of dementia and DKA. Histroy primarily from patient's wife  Joy Mitch.  Per report, patient with recurrent falls over the past 4 to 5 days while patient and family were out of town in [place].  Had to return home because of falls. Also with decreased p.o. intake.  Wife reports patient being noncompliant with all medications in the setting of advanced dementia.  Intermittently taking glipizide as well as Aricept.  Has had decreased p.o. intake over similar timeframe.  Patient has had difficulty with regular eating.  No reports of fevers or chills.  No reported abdominal pain.  Had episode of emesis yesterday that was noted by wife after patient fell asleep in the recliner.  There is a large amount of dark emesis on the floor after drinking prune juice earlier in the day.  Does not check blood sugars on a regular basis.  Was evaluated by PCP within the past few days because of recent falls.  Imaging grossly stable. Presented to the ER afebrile, hemodynamically stable.  Satting well on room air.  White count 12.8, hemoglobin 16.6, platelets 325, blood sugar into the 400s.  Beta hydroxybutyrate of 6.5.  VBG with a pH of 7.24.  Bicarb 14.  Patient was found to be in DKA on presentation and hospitalist service was contacted to admit patient for further management   Assessment and Plan:   * DKA (diabetic ketoacidosis) (HCC) Meeting DKA criteria w/ blood sugar in 400s, bicarb 14, pH 7.24  Have completed IV insulin Currently on subcutaneous insulin Continue carb controlled diet Monitor glucose closely      Hyperbilirubinemia Ultrasound did not show any evidence of cholecystitis   Fall Noted recent recurrent falls at home CT head grossly within normal limits. PT OT on board and recommending acute rehab     Dementia without behavioral disturbance (HCC) Continue home medications   Hypertension Monitor blood pressure Continue home medications    Transient atrial fibrillation Currently rate controlled Patient developed transient atrial fibrillation in the setting of his acidosis and DKA With recurrent falls we will weigh anticoagulation risk with patient and family Outpatient cardiology follow-up   Advance Care Planning:   Code Status: Full Code    Consults: None    Family Communication: Plan of care discussed w/ wife over the phone    Subjective:  Patient seen and examined at bedside this morning Denies nausea vomiting abdominal pain chest pain or cough   Physical Exam:   General: Not in any acute distress HENT:     Head: Normocephalic and atraumatic.     Nose: Nose normal.     Mouth/Throat:     Mouth: Mucous membranes are dry.  Eyes:     Pupils: Pupils are equal, round, and reactive to light.  Cardiovascular:     Rate and Rhythm: Normal rate and regular rhythm.  Pulmonary:     Effort: Pulmonary effort is normal.  Abdominal:     General: Bowel sounds are normal.     Tenderness: There is no abdominal tenderness.  Musculoskeletal:        General: Normal range  of motion.  Skin:    General: Skin is dry.  Neurological:     General: No focal deficit present.  Psychiatric:        Mood and Affect: Mood normal.   Data Reviewed: I have reviewed patient's ultrasound of the abdomen that did not show any findings of acute cholecystitis    Latest Ref Rng & Units 12/16/2023    8:10 AM 12/15/2023   11:11 AM 12/15/2023    3:37 AM  BMP  Glucose 70 - 99 mg/dL 213  086  578   BUN 8 - 23 mg/dL 18  27  33   Creatinine 0.61 - 1.24 mg/dL 4.69  6.29  5.28   Sodium 135 - 145  mmol/L 133  135  134   Potassium 3.5 - 5.1 mmol/L 3.3  3.5  3.9   Chloride 98 - 111 mmol/L 100  105  104   CO2 22 - 32 mmol/L 23  21  24    Calcium 8.9 - 10.3 mg/dL 8.1  8.2  8.6      Vitals:   12/15/23 2340 12/16/23 0421 12/16/23 0835 12/16/23 1540  BP: (!) 155/90 (!) 175/92 (!) 145/87 135/82  Pulse: 91 74 84 94  Resp: 18 20 16 16   Temp: 98.2 F (36.8 C) 97.6 F (36.4 C) 98.1 F (36.7 C) 98.2 F (36.8 C)  TempSrc: Oral Oral Oral Oral  SpO2: 98% 96% 98% 97%  Weight:      Height:          Latest Ref Rng & Units 12/16/2023    8:10 AM 12/15/2023    3:37 AM 12/14/2023   11:55 AM  CBC  WBC 4.0 - 10.5 K/uL 7.6  9.9  12.8   Hemoglobin 13.0 - 17.0 g/dL 41.3  24.4  01.0   Hematocrit 39.0 - 52.0 % 37.7  36.4  45.9   Platelets 150 - 400 K/uL 189  252  325      Author: Loyce Dys, MD 12/16/2023 3:52 PM  For on call review [email or web address].

## 2023-12-16 NOTE — [place] FL2 (Signed)
 [place] MEDICAID FL2 LEVEL OF CARE FORM     IDENTIFICATION  Patient Name: Ryan Hicks Birthdate: February 09, 1951 Sex: male Admission Date (Current Location): 12/14/2023  [facility] and [place] Number:  Chiropodist and Address:  [facility], [address], [place], [place] [address]      Provider Number: [id number]  Attending Physician Name and Address:  Loyce Dys, MD  Relative Name and Phone Number:  Belisle,Joy (Spouse)  [phone] (Mobile)    Current Level of Care: Hospital Recommended Level of Care: Skilled Nursing Facility Prior Approval Number:    Date Approved/Denied:   PASRR Number: [id number]  Discharge Plan:      Current Diagnoses: Patient Active Problem List   Diagnosis Date Noted   DKA (diabetic ketoacidosis) (HCC) 12/14/2023   Dementia without behavioral disturbance (HCC) 12/14/2023   Fall 12/14/2023   Hyperbilirubinemia 12/14/2023   Erectile dysfunction due to arterial insufficiency 03/31/2019   Rosacea 01/29/2019   Folliculitis 01/29/2019   Eczema 01/29/2019   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) 12/04/2018   BPH with obstruction/lower urinary tract symptoms 04/01/2018   Nephrolithiasis 05/01/2016   History of nephrolithiasis 11/24/2014   S/P cataract extraction and insertion of intraocular lens 08/05/2013   Pseudophakia of right eye 06/23/2013   ED (erectile dysfunction) of organic origin 09/02/2012   Glycosuria 09/02/2012   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 Yes with Assist)  AMBULATORY STATUS COMMUNICATION OF NEEDS Skin   Limited Assist Verbally Normal                       Personal Care Assistance Level of Assistance  Bathing, Feeding, Dressing Bathing Assistance: Limited assistance Feeding assistance: Limited assistance Dressing Assistance: Limited assistance     Functional Limitations Info             SPECIAL CARE FACTORS FREQUENCY  PT (By licensed PT), OT (By licensed OT)     PT Frequency: 5 times per week OT Frequency: 5 times per week            Contractures      Additional Factors Info  Code Status, Allergies Code Status Info: full Allergies Info: nka           Current Medications (12/16/2023):  This is the current hospital active medication list Current Facility-Administered Medications  Medication Dose Route Frequency Provider Last Rate Last Admin   acetaminophen (TYLENOL) tablet 650 mg  650 mg Oral Q6H PRN Floydene Flock, MD   650 mg at 12/15/23 1405   Or   acetaminophen (TYLENOL) suppository 650 mg  650 mg Rectal Q6H PRN Floydene Flock, MD       dextrose 50 % solution 0-50 mL  0-50 mL Intravenous PRN Janith Lima, MD       donepezil (ARICEPT) tablet 10 mg  10 mg Oral Daily Loyce Dys, MD   10 mg at 12/16/23 [phone]  enoxaparin (LOVENOX) injection 40 mg  40 mg Subcutaneous Q24H Floydene Flock, MD   40 mg at 12/15/23 2058   insulin aspart (novoLOG) injection 0-15 Units  0-15 Units Subcutaneous TID AC & HS Manuela Schwartz, NP   5 Units at 12/16/23 0915   insulin glargine-yfgn (SEMGLEE) injection 10 Units  10 Units Subcutaneous Daily Manuela Schwartz, NP   10 Units at 12/16/23 1610   labetalol (NORMODYNE) injection 10 mg  10 mg Intravenous Q2H PRN Floydene Flock, MD       lactated ringers bolus 1,924 mL  20 mL/kg Intravenous Once Janith Lima, MD       ondansetron [facility]) tablet 4 mg  4 mg Oral Q6H PRN Floydene Flock, MD       Or   ondansetron [facility]) injection 4 mg  4 mg Intravenous Q6H PRN Floydene Flock, MD       pioglitazone (ACTOS) tablet 15 mg  15 mg Oral Daily Rosezetta Schlatter T, MD   15 mg at 12/16/23 0904   potassium chloride SA (KLOR-CON M) CR tablet 40 mEq  40 mEq Oral Q4H Rosezetta Schlatter T, MD   40 mEq at 12/16/23 0904   tamsulosin (FLOMAX) capsule 0.4 mg  0.4 mg Oral Daily Rosezetta Schlatter T, MD   0.4 mg at 12/16/23 0904   traMADol (ULTRAM) tablet 50 mg  50 mg Oral Q6H PRN Loyce Dys, MD   50 mg at 12/15/23 1533     Discharge Medications: Please see discharge summary for a list of discharge medications.  Relevant Imaging Results:  Relevant Lab Results:   Additional Information SS #: [id number]  Esta Carmon E Kagen Kunath, LCSW

## 2023-12-16 NOTE — Evaluation (Signed)
 Occupational Therapy Evaluation Patient Details Name: Ryan Hicks MRN: [id number] DOB: 1951/10/13 Today's Date: 12/16/2023   History of Present Illness   Pt is a 73 y/o M admitted on 12/14/23 after presenting with c/o DKA & fall. PMH: advanced dementia, HLD, CVA, heterozygous factor V leiden mutation, medical noncompliance     Clinical Impressions Mr Urton was seen for OT evaluation this date. Prior to hospital admission, pt was IND. Pt lives with spouse in 2 level home. Pt presents to acute OT demonstrating impaired ADL performance and functional mobility 2/2 decreased activity tolerance and functional strength/balance deficits. Pt currently requires MIN A bed mobility. CGA standing grooming tasks. MAX A for LB access in sitting. MIN A + RW for ADL t/f ~200 ft. Cues for RW use. Pt would benefit from skilled OT to address noted impairments and functional limitations (see below for any additional details). Upon hospital discharge, recommend OT follow up <3 hours/day.    If plan is discharge home, recommend the following:   A little help with walking and/or transfers;A little help with bathing/dressing/bathroom;Supervision due to cognitive status;Help with stairs or ramp for entrance     Functional Status Assessment   Patient has had a recent decline in their functional status and demonstrates the ability to make significant improvements in function in a reasonable and predictable amount of time.     Equipment Recommendations   BSC/3in1     Recommendations for Other Services         Precautions/Restrictions   Precautions Precautions: Fall Restrictions Weight Bearing Restrictions Per Provider Order: No     Mobility Bed Mobility Overal bed mobility: Needs Assistance Bed Mobility: Rolling, Sidelying to Sit Rolling: Min assist Sidelying to sit: Min assist            Transfers Overall transfer level: Needs assistance Equipment used: Rolling walker (2  wheels) Transfers: Sit to/from Stand Sit to Stand: Contact guard assist           General transfer comment: MIN A from bed, CGA from chair with cues      Balance Overall balance assessment: Needs assistance, History of Falls Sitting-balance support: Feet supported Sitting balance-Leahy Scale: Good     Standing balance support: Single extremity supported, During functional activity Standing balance-Leahy Scale: Fair                             ADL either performed or assessed with clinical judgement   ADL Overall ADL's : Needs assistance/impaired                                       General ADL Comments: CGA standing grooming tasks. MAX A for LB access in sitting. MIN A + RW for ADL t/f ~200 ft.      Pertinent Vitals/Pain Pain Assessment Pain Assessment: No/denies pain     Extremity/Trunk Assessment Upper Extremity Assessment Upper Extremity Assessment: Overall WFL for tasks assessed   Lower Extremity Assessment Lower Extremity Assessment: Generalized weakness       Communication Communication Communication: No apparent difficulties   Cognition Arousal: Alert Behavior During Therapy: WFL for tasks assessed/performed Cognition: Cognition impaired   Orientation impairments: Time, Situation   Memory impairment (select all impairments): Short-term memory  Following commands: Impaired Following commands impaired: Only follows one step commands consistently, Follows one step commands with increased time                Home Living Family/patient expects to be discharged to:: Private residence Living Arrangements: Spouse/significant other Available Help at Discharge: Family;Available 24 hours/day Type of Home: House Home Access: Stairs to enter [employer] of Steps: 1 Entrance Stairs-Rails: None Home Layout: Two level;Bed/bath upstairs Alternate Level Stairs-Number of Steps:  flight Alternate Level Stairs-Rails: Right;Left           Home Equipment: Agricultural consultant (2 wheels)          Prior Functioning/Environment Prior Level of Function : Independent/Modified Independent             Mobility Comments: Prior to this week pt was ambulatory without AD but pt has experienced 3 falls in the past ~10 days & now requiring up to 2 person assist at home for transfers & gait; pt also started using RW after falls. No longer driving. ADLs Comments: Independent with bathing, dressing prior to falls leading to admission. "Allowed" his wife to manage 3 of his medications vs all of them.    OT Problem List: Decreased range of motion;Decreased strength;Decreased activity tolerance;Impaired balance (sitting and/or standing);Decreased safety awareness   OT Treatment/Interventions: Self-care/ADL training;Therapeutic exercise;Energy conservation;DME and/or AE instruction;Therapeutic activities;Patient/family education;Balance training      OT Goals(Current goals can be found in the care plan section)   Acute Rehab OT Goals Patient Stated Goal: to go home OT Goal Formulation: With patient/family Time For Goal Achievement: 12/30/23 Potential to Achieve Goals: Good ADL Goals Pt Will Perform Grooming: with modified independence;standing Pt Will Perform Lower Body Dressing: with modified independence;sit to/from stand Pt Will Transfer to Toilet: with modified independence;ambulating;regular height toilet   OT Frequency:  Min 1X/week    Co-evaluation              AM-PAC OT "6 Clicks" Daily Activity     Outcome Measure Help from another person eating meals?: None Help from another person taking care of personal grooming?: A Little Help from another person toileting, which includes using toliet, bedpan, or urinal?: A Little Help from another person bathing (including washing, rinsing, drying)?: A Lot Help from another person to put on and taking off regular  upper body clothing?: A Little Help from another person to put on and taking off regular lower body clothing?: A Lot 6 Click Score: 17   End of Session Equipment Utilized During Treatment: Rolling walker (2 wheels);Gait belt Nurse Communication: Mobility status  Activity Tolerance: Patient tolerated treatment well Patient left: in chair;with call bell/phone within reach;with family/visitor present  OT Visit Diagnosis: Other abnormalities of gait and mobility (R26.89);Muscle weakness (generalized) (M62.81)                Time: 4098-1191 OT Time Calculation (min): 21 min Charges:  OT General Charges $OT Visit: 1 Visit OT Evaluation $OT Eval Moderate Complexity: 1 Mod OT Treatments $Self Care/Home Management : 8-22 mins  Kathie Dike, M.S. OTR/L  12/16/23, 3:49 PM  ascom [phone]

## 2023-12-17 ENCOUNTER — Ambulatory Visit: Payer: Medicare PPO | Admitting: Urology

## 2023-12-17 DIAGNOSIS — R262 Difficulty in walking, not elsewhere classified: Secondary | ICD-10-CM

## 2023-12-17 DIAGNOSIS — E1110 Type 2 diabetes mellitus with ketoacidosis without coma: Secondary | ICD-10-CM | POA: Diagnosis not present

## 2023-12-17 LAB — CBC WITH DIFFERENTIAL/PLATELET
Abs Immature Granulocytes: 0.04 10*3/uL (ref 0.00–0.07)
Basophils Absolute: 0 10*3/uL (ref 0.0–0.1)
Basophils Relative: 0 %
Eosinophils Absolute: 0.1 10*3/uL (ref 0.0–0.5)
Eosinophils Relative: 1 %
HCT: 35.1 % — ABNORMAL LOW (ref 39.0–52.0)
Hemoglobin: 13.1 g/dL (ref 13.0–17.0)
Immature Granulocytes: 1 %
Lymphocytes Relative: 20 %
Lymphs Abs: 1.6 10*3/uL (ref 0.7–4.0)
MCH: 30.2 pg (ref 26.0–34.0)
MCHC: 37.3 g/dL — ABNORMAL HIGH (ref 30.0–36.0)
MCV: 80.9 fL (ref 80.0–100.0)
Monocytes Absolute: 0.8 10*3/uL (ref 0.1–1.0)
Monocytes Relative: 9 %
Neutro Abs: 5.6 10*3/uL (ref 1.7–7.7)
Neutrophils Relative %: 69 %
Platelets: 187 10*3/uL (ref 150–400)
RBC: 4.34 MIL/uL (ref 4.22–5.81)
RDW: 11.6 % (ref 11.5–15.5)
WBC: 8.1 10*3/uL (ref 4.0–10.5)
nRBC: 0 % (ref 0.0–0.2)

## 2023-12-17 LAB — BETA-HYDROXYBUTYRIC ACID
Beta-Hydroxybutyric Acid: 0.19 mmol/L (ref 0.05–0.27)
Beta-Hydroxybutyric Acid: 0.95 mmol/L — ABNORMAL HIGH (ref 0.05–0.27)
Beta-Hydroxybutyric Acid: 1.32 mmol/L — ABNORMAL HIGH (ref 0.05–0.27)
Beta-Hydroxybutyric Acid: 2.36 mmol/L — ABNORMAL HIGH (ref 0.05–0.27)

## 2023-12-17 LAB — GLUCOSE, CAPILLARY
Glucose-Capillary: 200 mg/dL — ABNORMAL HIGH (ref 70–99)
Glucose-Capillary: 220 mg/dL — ABNORMAL HIGH (ref 70–99)
Glucose-Capillary: 228 mg/dL — ABNORMAL HIGH (ref 70–99)
Glucose-Capillary: 196 mg/dL — ABNORMAL HIGH (ref 70–99)
Glucose-Capillary: 189 mg/dL — ABNORMAL HIGH (ref 70–99)

## 2023-12-17 LAB — BASIC METABOLIC PANEL
Anion gap: 10 (ref 5–15)
BUN: 17 mg/dL (ref 8–23)
CO2: 26 mmol/L (ref 22–32)
Calcium: 8 mg/dL — ABNORMAL LOW (ref 8.9–10.3)
Chloride: 99 mmol/L (ref 98–111)
Creatinine, Ser: 0.72 mg/dL (ref 0.61–1.24)
GFR, Estimated: >60 mL/min (ref >60)
Glucose, Bld: 178 mg/dL — ABNORMAL HIGH (ref 70–99)
Potassium: 3.2 mmol/L — ABNORMAL LOW (ref 3.5–5.1)
Sodium: 135 mmol/L (ref 135–145)

## 2023-12-17 MED ORDER — INSULIN ASPART 100 UNIT/ML IJ SOLN
3.0000 [IU] | Freq: Three times a day (TID) | INTRAMUSCULAR | Status: DC
  Administered 2023-12-17 – 2023-12-18 (×4): 3 [IU] via SUBCUTANEOUS
  Filled 2023-12-17 (×4): qty 1

## 2023-12-17 MED ORDER — INSULIN ASPART 100 UNIT/ML IJ SOLN: 0.0000 [IU] | Freq: Three times a day (TID) | INTRAMUSCULAR | Status: DC

## 2023-12-17 MED ORDER — GLUCOSAMINE-CHONDROITIN 500-400 MG PO TABS: 1.0000 | ORAL_TABLET | Freq: Three times a day (TID) | ORAL | Status: DC

## 2023-12-17 MED ORDER — POTASSIUM CHLORIDE CRYS ER 20 MEQ PO TBCR
40.0000 meq | EXTENDED_RELEASE_TABLET | ORAL | Status: AC
  Administered 2023-12-17 (×2): 40 meq via ORAL
  Filled 2023-12-17 (×2): qty 2

## 2023-12-17 NOTE — Inpatient Diabetes Management (Signed)
 Inpatient Diabetes Program Recommendations  AACE/ADA: New Consensus Statement on Inpatient Glycemic Control   Target Ranges:  Prepandial:   less than 140 mg/dL      Peak postprandial:   less than 180 mg/dL (1-2 hours)      Critically ill patients:  140 - 180 mg/dL    Latest Reference Range & Units 12/16/23 08:36 12/16/23 11:35 12/16/23 15:42 12/16/23 17:32 12/16/23 22:13 12/17/23 08:06  Glucose-Capillary 70 - 99 mg/dL 811 (H) 914 (H) 782 (H) 264 (H) 193 (H) 200 (H)    Latest Reference Range & Units 12/14/23 19:31  Hemoglobin A1C 4.8 - 5.6 % 11.1 (H)    Latest Reference Range & Units 12/14/23 11:55  CO2 22 - 32 mmol/L 14 (L)  Glucose 70 - 99 mg/dL 956 (H)  Anion gap 5 - 15  23 (H)    Latest Reference Range & Units 12/14/23 14:54 12/14/23 19:31 12/15/23 01:16  Beta-Hydroxybutyric Acid 0.05 - 0.27 mmol/L 6.50 (H) 7.64 (H) 1.98 (H)   Review of Glycemic Control  Diabetes history: DM2 Outpatient Diabetes medications: Actos 15 mg daily (only DM med listed on home med list; per PCP note on 12/13/23 pt is prescribed Glipizide 10 mg BID, Actos 15 mg daily, and Metformin 1000 mg BID for DM) Current orders for Inpatient glycemic control: Semglee 15 units daily, Novolog 0-15 units AC&HS, Actos 15 mg daily  Inpatient Diabetes Program Recommendations:    Insulin: Noted Semglee increased from 10 to 15 units daily on 12/16/23 and patient will receive increased dose today. Please consider ordering Novolog 3 units TID with meals for meal coverage if patient eats at least 50% of meals.  Oral DM medication: Would recommend to discontinue Actos and use insulin regimen to DM control.   HbgA1C: A1C 11.1% on 12/14/23 indicating an average glucose of 272 mg/dl over the past 2-3 months. Per Care Everywhere, A1C was 11.8% on 10/29/23.  NOTE: Patient admitted 12/14/23 with DKA, hyperbilirubinemia, and recurrent falls; has history of dementia. Per H&P on 12/14/23, " Limited history in setting of dementia and DKA.  Histroy primarily from patient's wife Joy Harned. Per report, patient with recurrent falls over the past 4 to 5 days while patient and family were out of town in [place]. Had to return home because of falls. Also with decreased p.o. intake. Wife reports patient being noncompliant with all medications in the setting of advanced dementia. Intermittently taking glipizide as well as Aricept. Has had decreased p.o. intake over similar timeframe. Patient has had difficulty with regular eating."  Question if acidosis on presentation partly due to starvation ketosis due to poor PO intake over prior 4-5 days. Per notes, patient will be discharging to SNF. Would recommend to discharge on insulin regimen for DM control.   Thanks, Orlando Penner, RN, MSN, CDCES Diabetes Coordinator Inpatient Diabetes Program [phone] (Team Pager from 8am to 5pm)

## 2023-12-17 NOTE — Progress Notes (Signed)
 PHARMACIST - PHYSICIAN ORDER COMMUNICATION  CONCERNING: P&T Medication Policy on Herbal Medications  DESCRIPTION:  This patient's order for:  Glucosamine-Chondroitin 500mg -400mg   has been noted.  This product(s) is classified as an "herbal" or natural product. Due to a lack of definitive safety studies or FDA approval, nonstandard manufacturing practices, plus the potential risk of unknown drug-drug interactions while on inpatient medications, the Pharmacy and Therapeutics Committee does not permit the use of "herbal" or natural products of this type within [facility].   ACTION TAKEN: The pharmacy department is unable to verify this order at this time and your patient has been informed of this safety policy. Please reevaluate patient's clinical condition at discharge and address if the herbal or natural product(s) should be resumed at that time.

## 2023-12-17 NOTE — Plan of Care (Signed)
   Problem: Education: Goal: Ability to describe self-care measures that may prevent or decrease complications (Diabetes Survival Skills Education) will improve Outcome: Progressing

## 2023-12-17 NOTE — Progress Notes (Signed)
 Progress Note   Patient: Ryan Hicks DOB: [birth year] DOA: 12/14/2023     0 DOS: the patient was seen and examined on 12/17/2023     Brief hospital course: From HPI "Ryan Hicks is a 73 y.o. male with medical history significant of advanced dementia, HLD, prior CVA, heterozygous factor V leiden mutation. Medical noncompliance presenting w/ DKA, fall. Limited history in setting of dementia and DKA. Histroy primarily from patient's wife  Joy Spayd.  Per report, patient with recurrent falls over the past 4 to 5 days while patient and family were out of town in [place].  Had to return home because of falls. Also with decreased p.o. intake.  Wife reports patient being noncompliant with all medications in the setting of advanced dementia.  Intermittently taking glipizide as well as Aricept.  Has had decreased p.o. intake over similar timeframe.  Patient has had difficulty with regular eating.  No reports of fevers or chills.  No reported abdominal pain.  Had episode of emesis yesterday that was noted by wife after patient fell asleep in the recliner.  There is a large amount of dark emesis on the floor after drinking prune juice earlier in the day.  Does not check blood sugars on a regular basis.  Was evaluated by PCP within the past few days because of recent falls.  Imaging grossly stable. Presented to the ER afebrile, hemodynamically stable.  Satting well on room air.  White count 12.8, hemoglobin 16.6, platelets 325, blood sugar into the 400s.  Beta hydroxybutyrate of 6.5.  VBG with a pH of 7.24.  Bicarb 14.  Patient was found to be in DKA on presentation and hospitalist service was contacted to admit patient for further management "   Assessment and Plan:   * DKA (diabetic ketoacidosis) (HCC)-resolved Meeting DKA criteria w/ blood sugar in 400s, bicarb 14, pH 7.24  Have completed IV insulin Currently on subcutaneous insulin Continue carb controlled diet Monitor glucose closely      Hyperbilirubinemia Ultrasound did not show any evidence of cholecystitis   Fall Noted recent recurrent falls at home CT head grossly within normal limits. PT OT on board and recommending acute rehab     Dementia without behavioral disturbance (HCC) Continue home medication   Hypertension Monitor blood pressure Continue home medication     Transient atrial fibrillation Currently rate controlled Patient developed transient atrial fibrillation in the setting of his acidosis and DKA With recurrent falls we will weigh anticoagulation risk with patient and family Outpatient cardiology follow-up    Advance Care Planning:   Code Status: Full Code    Consults: None    Family Communication: Plan of care discussed w/ wife over the phone    Subjective:  Patient seen and examined in the presence of the wife Denies nausea vomiting abdominal pain chest pain   Physical Exam:   General: Not in any acute distress HENT:     Head: Normocephalic and atraumatic.  Eyes:     Pupils: Pupils are equal, round, and reactive to light.  Cardiovascular:     Rate and Rhythm: Normal rate and regular rhythm.  Pulmonary:     Effort: Pulmonary effort is normal.  Abdominal:     General: Bowel sounds are normal.     Tenderness: There is no abdominal tenderness.  Musculoskeletal:        General: Normal range of motion.  Skin:    General: Skin is dry.  Neurological:     General:  No focal deficit present.  Psychiatric:        Mood and Affect: Mood normal.    Data Reviewed: I have reviewed patient's ultrasound of the abdomen that did not show any findings of acute cholecystitis   Vitals:   12/16/23 2015 12/17/23 0353 12/17/23 0804 12/17/23 1722  BP: (!) 178/96 (!) 155/88 (!) 147/93 (!) 162/94  Pulse: 86 89 84 92  Resp: 19 20 18 18   Temp: 98 F (36.7 C) (!) 97.3 F (36.3 C) 98 F (36.7 C) 97.7 F (36.5 C)  TempSrc: Oral Oral  Oral  SpO2: 98% 96% 95% 94%  Weight:      Height:           Latest Ref Rng & Units 12/17/2023   12:01 AM 12/16/2023    8:10 AM 12/15/2023    3:37 AM  CBC  WBC 4.0 - 10.5 K/uL 8.1  7.6  9.9   Hemoglobin 13.0 - 17.0 g/dL 40.9  81.1  91.4   Hematocrit 39.0 - 52.0 % 35.1  37.7  36.4   Platelets 150 - 400 K/uL 187  189  252        Latest Ref Rng & Units 12/17/2023   12:01 AM 12/16/2023    8:10 AM 12/15/2023   11:11 AM  BMP  Glucose 70 - 99 mg/dL 782  956  213   BUN 8 - 23 mg/dL 17  18  27    Creatinine 0.61 - 1.24 mg/dL 0.86  5.78  4.69   Sodium 135 - 145 mmol/L 135  133  135   Potassium 3.5 - 5.1 mmol/L 3.2  3.3  3.5   Chloride 98 - 111 mmol/L 99  100  105   CO2 22 - 32 mmol/L 26  23  21    Calcium 8.9 - 10.3 mg/dL 8.0  8.1  8.2      Disposition: Currently awaiting rehab placement  Author: Loyce Dys, MD 12/17/2023 6:50 PM  For on call review [email or web address].

## 2023-12-17 NOTE — Progress Notes (Signed)
 Physical Therapy Treatment Patient Details Name: Ryan Hicks MRN: [id number] DOB: [birth year] Today's Date: 12/17/2023   History of Present Illness Pt is a 73 y/o M admitted on 12/14/23 after presenting with c/o DKA & fall. PMH: advanced dementia, HLD, CVA, heterozygous factor V leiden mutation, medical noncompliance    PT Comments  Pt received in bed, wife at bedside. Discussed role of PT and POC. Pt c/c of chronic LBP requiring Min/ModA for bed mobility and transfers with increased time to complete. Once sitting EOB pt required cues for hand placement during sit to stand transfer at [facility]. Good tolerance for gait training in hallway, however pt requires repeated cues to stay within RW for ideal support and safety. No LOB despite poor use of AD and flexed forward posture due to LBP. Pt positioned in "chair position" in bed due recliner being uncomfortable. Overall, great tolerance for mobility, no c/o dizziness, and pt remains very motivated to participate. Continue PT per POC, awaiting STR prior to returning home with supportive wife.    If plan is discharge home, recommend the following: Assist for transportation;A lot of help with bathing/dressing/bathroom;Direct supervision/assist for financial management;Supervision due to cognitive status;Assistance with cooking/housework;Direct supervision/assist for medications management;Help with stairs or ramp for entrance;A little help with walking and/or transfers   Can travel by private vehicle     No  Equipment Recommendations  Other (comment)    Recommendations for Other Services       Precautions / Restrictions Precautions Precautions: Fall Restrictions Weight Bearing Restrictions Per Provider Order: No     Mobility  Bed Mobility Overal bed mobility: Needs Assistance Bed Mobility: Rolling, Sidelying to Sit Rolling: Min assist Sidelying to sit: Min assist, Mod assist   Sit to supine: Min assist   General bed mobility comments:  cuing for log rolling for supine>sit, bed mobility fluctuates depending on pt's LBP    Transfers Overall transfer level: Needs assistance Equipment used: Rolling walker (2 wheels) Transfers: Sit to/from Stand Sit to Stand: Contact guard assist           General transfer comment: Cues for hand placement    Ambulation/Gait Ambulation/Gait assistance: Contact guard assist Gait Distance (Feet): 160 Feet Assistive device: Rolling walker (2 wheels) Gait Pattern/deviations: Decreased step length - right, Decreased step length - left, Decreased stride length, Step-through pattern, Trunk flexed Gait velocity: decreased     General Gait Details: Pt pushing RW out in front of him with poor ability to ambulate within base of AD despite ongoing cuing. Unable to fully stand upright, pt with c/o back pain.   Stairs Stairs:  (Pt has 1 step to enter with shower on second floor)           Wheelchair Mobility     Tilt Bed    Modified Rankin (Stroke Patients Only)       Balance Overall balance assessment: Needs assistance, History of Falls Sitting-balance support: Feet supported Sitting balance-Leahy Scale: Good Sitting balance - Comments: close supervision<>CGA static sitting EOB   Standing balance support: Bilateral upper extremity supported, During functional activity, Reliant on assistive device for balance Standing balance-Leahy Scale: Fair Standing balance comment: RW to offset pressure on lower back, hx of frequent falls at home                            Communication Communication Communication: No apparent difficulties  Cognition Arousal: Alert Behavior During Therapy: [facility] for tasks assessed/performed  PT - Cognitive impairments: History of cognitive impairments, Orientation, Awareness, Memory, Problem solving, Safety/Judgement   Orientation impairments: Place                   PT - Cognition Comments: Pt with hx of Alzheimers Following  commands: Impaired Following commands impaired: Only follows one step commands consistently, Follows one step commands with increased time    Cueing Cueing Techniques: Verbal cues, Tactile cues, Visual cues  Exercises      General Comments General comments (skin integrity, edema, etc.): Pt and wife educated on role of PT, benefits of OOB and increasing mobility. Pt very pleasant and cooperative throughout session.      Pertinent Vitals/Pain Pain Assessment Pain Assessment: Faces Faces Pain Scale: Hurts even more Pain Location: low back Pain Descriptors / Indicators: Discomfort, Grimacing, Guarding Pain Intervention(s): Monitored during session    Home Living                          Prior Function            PT Goals (current goals can now be found in the care plan section) Acute Rehab PT Goals Patient Stated Goal: get better, decreased pain Progress towards PT goals: Progressing toward goals    Frequency    Min 1X/week      PT Plan      Co-evaluation              AM-PAC PT "6 Clicks" Mobility   Outcome Measure  Help needed turning from your back to your side while in a flat bed without using bedrails?: A Little Help needed moving from lying on your back to sitting on the side of a flat bed without using bedrails?: A Lot Help needed moving to and from a bed to a chair (including a wheelchair)?: A Lot Help needed standing up from a chair using your arms (e.g., wheelchair or bedside chair)?: A Little Help needed to walk in hospital room?: A Little Help needed climbing 3-5 steps with a railing? : A Lot 6 Click Score: 15    End of Session Equipment Utilized During Treatment: Gait belt Activity Tolerance: Patient tolerated treatment well Patient left: in bed;with bed alarm set;with call bell/phone within reach;with family/visitor present;Other (comment) (Chair position in bed) Nurse Communication: Mobility status PT Visit Diagnosis: Pain;Muscle  weakness (generalized) (M62.81);Other abnormalities of gait and mobility (R26.89);Unsteadiness on feet (R26.81);History of falling (Z91.81);Difficulty in walking, not elsewhere classified (R26.2) Pain - part of body:  (lower back)     Time: 1411-1440 PT Time Calculation (min) (ACUTE ONLY): 29 min  Charges:    $Gait Training: 8-22 mins $Therapeutic Activity: 8-22 mins PT General Charges $$ ACUTE PT VISIT: 1 Visit                    Zadie Cleverly, PTA  Jannet Askew 12/17/2023, 4:19 PM

## 2023-12-18 DIAGNOSIS — E1110 Type 2 diabetes mellitus with ketoacidosis without coma: Secondary | ICD-10-CM | POA: Diagnosis not present

## 2023-12-18 LAB — GLUCOSE, CAPILLARY
Glucose-Capillary: 200 mg/dL — ABNORMAL HIGH (ref 70–99)
Glucose-Capillary: 253 mg/dL — ABNORMAL HIGH (ref 70–99)
Glucose-Capillary: 244 mg/dL — ABNORMAL HIGH (ref 70–99)
Glucose-Capillary: 171 mg/dL — ABNORMAL HIGH (ref 70–99)

## 2023-12-18 LAB — CBC WITH DIFFERENTIAL/PLATELET
Abs Immature Granulocytes: 0.05 10*3/uL (ref 0.00–0.07)
Basophils Absolute: 0 10*3/uL (ref 0.0–0.1)
Basophils Relative: 0 %
Eosinophils Absolute: 0.1 10*3/uL (ref 0.0–0.5)
Eosinophils Relative: 1 %
HCT: 37.5 % — ABNORMAL LOW (ref 39.0–52.0)
Hemoglobin: 13.7 g/dL (ref 13.0–17.0)
Immature Granulocytes: 1 %
Lymphocytes Relative: 14 %
Lymphs Abs: 1.3 10*3/uL (ref 0.7–4.0)
MCH: 30.1 pg (ref 26.0–34.0)
MCHC: 36.5 g/dL — ABNORMAL HIGH (ref 30.0–36.0)
MCV: 82.4 fL (ref 80.0–100.0)
Monocytes Absolute: 0.8 10*3/uL (ref 0.1–1.0)
Monocytes Relative: 9 %
Neutro Abs: 6.9 10*3/uL (ref 1.7–7.7)
Neutrophils Relative %: 75 %
Platelets: 220 10*3/uL (ref 150–400)
RBC: 4.55 MIL/uL (ref 4.22–5.81)
RDW: 11.6 % (ref 11.5–15.5)
WBC: 9.2 10*3/uL (ref 4.0–10.5)
nRBC: 0 % (ref 0.0–0.2)

## 2023-12-18 LAB — BETA-HYDROXYBUTYRIC ACID
Beta-Hydroxybutyric Acid: 0.33 mmol/L — ABNORMAL HIGH (ref 0.05–0.27)
Beta-Hydroxybutyric Acid: 1.38 mmol/L — ABNORMAL HIGH (ref 0.05–0.27)

## 2023-12-18 LAB — BASIC METABOLIC PANEL
Anion gap: 10 (ref 5–15)
BUN: 14 mg/dL (ref 8–23)
CO2: 25 mmol/L (ref 22–32)
Calcium: 8.1 mg/dL — ABNORMAL LOW (ref 8.9–10.3)
Chloride: 98 mmol/L (ref 98–111)
Creatinine, Ser: 0.69 mg/dL (ref 0.61–1.24)
GFR, Estimated: >60 mL/min (ref >60)
Glucose, Bld: 166 mg/dL — ABNORMAL HIGH (ref 70–99)
Potassium: 3.5 mmol/L (ref 3.5–5.1)
Sodium: 133 mmol/L — ABNORMAL LOW (ref 135–145)

## 2023-12-18 NOTE — Progress Notes (Signed)
 Progress Note   Patient: Ryan Hicks ZOX:[id number] DOB: 06/08/1951 DOA: 12/14/2023     0 DOS: the patient was seen and examined on 12/18/2023     Brief hospital course: From HPI "Ryan Hicks is a 73 y.o. male with medical history significant of advanced dementia, HLD, prior CVA, heterozygous factor V leiden mutation. Medical noncompliance presenting w/ DKA, fall. Limited history in setting of dementia and DKA. Histroy primarily from patient's wife  Ryan Hicks.  Per report, patient with recurrent falls over the past 4 to 5 days while patient and family were out of town in [place].  Had to return home because of falls. Also with decreased p.o. intake.  Wife reports patient being noncompliant with all medications in the setting of advanced dementia.  Intermittently taking glipizide as well as Aricept.  Has had decreased p.o. intake over similar timeframe.  Patient has had difficulty with regular eating.  No reports of fevers or chills.  No reported abdominal pain.  Had episode of emesis yesterday that was noted by wife after patient fell asleep in the recliner.  There is a large amount of dark emesis on the floor after drinking prune juice earlier in the day.  Does not check blood sugars on a regular basis.  Was evaluated by PCP within the past few days because of recent falls.  Imaging grossly stable. Presented to the ER afebrile, hemodynamically stable.  Satting well on room air.  White count 12.8, hemoglobin 16.6, platelets 325, blood sugar into the 400s.  Beta hydroxybutyrate of 6.5.  VBG with a pH of 7.24.  Bicarb 14.  Patient was found to be in DKA on presentation and hospitalist service was contacted to admit patient for further management "   Assessment and Plan:   * DKA (diabetic ketoacidosis) (HCC)-resolved Meeting DKA criteria w/ blood sugar in 400s, bicarb 14, pH 7.24  Have completed IV insulin Currently on subcutaneous insulin Continue carb controlled diet Monitor glucose closely      Hyperbilirubinemia Ultrasound did not show any evidence of cholecystitis   Fall Noted recent recurrent falls at home CT head grossly within normal limits. PT OT on board and recommending acute rehab     Dementia without behavioral disturbance (HCC) Continue home medication   Hypertension Monitor blood pressure Continue home medication     Transient atrial fibrillation Currently rate controlled Patient developed transient atrial fibrillation in the setting of his acidosis and DKA With recurrent falls as well as the transient nature of this and being precipitated by we will continue to weigh anticoagulation risk with patient and family Outpatient cardiology follow-up    Advance Care Planning:   Code Status: Full Code    Consults: None    Family Communication: Plan of care discussed w/ wife over the phone    Subjective:  Patient seen and examined in the presence of the wife Patient denies any acute overnight events He feels a whole lot better today TOC still working on placement   Physical Exam:   General: Not in any acute distress HENT:     Head: Normocephalic and atraumatic.  Eyes:     Pupils: Pupils are equal, round, and reactive to light.  Cardiovascular:     Rate and Rhythm: Normal rate and regular rhythm.  Pulmonary:     Effort: Pulmonary effort is normal.  Abdominal:     General: Bowel sounds are normal.     Tenderness: There is no abdominal tenderness.  Musculoskeletal:  General: Normal range of motion.  Skin:    General: Skin is dry.  Neurological:     General: No focal deficit present.  Psychiatric:        Mood and Affect: Mood normal.    Data Reviewed: I have reviewed patient's ultrasound of the abdomen that did not show any findings of acute cholecystitis   Vitals:   12/17/23 1722 12/17/23 1948 12/18/23 0408 12/18/23 0837  BP: (!) 162/94 (!) 160/93 (!) 149/87 (!) 169/85  Pulse: 92 90 93 85  Resp: 18 20 20 16   Temp: 97.7 F (36.5 C)  98 F (36.7 C) 97.6 F (36.4 C) (!) 97.5 F (36.4 C)  TempSrc: Oral Oral Oral Oral  SpO2: 94% 97% 97% 93%  Weight:      Height:          Latest Ref Rng & Units 12/18/2023    6:06 AM 12/17/2023   12:01 AM 12/16/2023    8:10 AM  CBC  WBC 4.0 - 10.5 K/uL 9.2  8.1  7.6   Hemoglobin 13.0 - 17.0 g/dL 16.1  09.6  04.5   Hematocrit 39.0 - 52.0 % 37.5  35.1  37.7   Platelets 150 - 400 K/uL 220  187  189        Latest Ref Rng & Units 12/18/2023    6:06 AM 12/17/2023   12:01 AM 12/16/2023    8:10 AM  BMP  Glucose 70 - 99 mg/dL 409  811  914   BUN 8 - 23 mg/dL 14  17  18    Creatinine 0.61 - 1.24 mg/dL 7.82  9.56  2.13   Sodium 135 - 145 mmol/L 133  135  133   Potassium 3.5 - 5.1 mmol/L 3.5  3.2  3.3   Chloride 98 - 111 mmol/L 98  99  100   CO2 22 - 32 mmol/L 25  26  23    Calcium 8.9 - 10.3 mg/dL 8.1  8.0  8.1      Disposition: Currently awaiting rehab placement    Author: Loyce Dys, MD 12/18/2023 4:27 PM  For on call review [email or web address].

## 2023-12-18 NOTE — Plan of Care (Signed)

## 2023-12-18 NOTE — Progress Notes (Signed)
 Physical Therapy Treatment Patient Details Name: Ryan Hicks MRN: [id number] DOB: 04-07-1951 Today's Date: 12/18/2023   History of Present Illness Pt is a 73 y/o M admitted on 12/14/23 after presenting with c/o DKA & fall. PMH: advanced dementia, HLD, CVA, heterozygous factor V leiden mutation, medical noncompliance    PT Comments  Pt in room with nursing prepping to move to room 208. Once moved, pt assisted with settling in and board updated. Pt agreed to OOB activity. ModA with increased time to transfer to EOB with focus on log rolling as able due to chronic LBP. Sitting upright on EOB with good static balance for several minutes. Pt transferred to standing at RW from raised bed with CGA, ModA from low surface. Gait training in hallway with RW, repeated cues to stay inside RW, poor carry over. Pt positioned in recliner upon return to room and set up for lunch. Chair alarm on, call bell in reach. Pt remains limited due to chronic LBP and general weakness. Initial recs for STR upon d/c remain appropriate.     If plan is discharge home, recommend the following: Assist for transportation;A lot of help with bathing/dressing/bathroom;Direct supervision/assist for financial management;Supervision due to cognitive status;Assistance with cooking/housework;Direct supervision/assist for medications management;Help with stairs or ramp for entrance;A little help with walking and/or transfers   Can travel by private vehicle     No  Equipment Recommendations  Other (comment) (TBD at next level of care)    Recommendations for Other Services       Precautions / Restrictions Precautions Precautions: Fall Restrictions Weight Bearing Restrictions Per Provider Order: No     Mobility  Bed Mobility Overal bed mobility: Needs Assistance Bed Mobility: Rolling, Sidelying to Sit Rolling: Min assist Sidelying to sit: Mod assist   Sit to supine: Min assist, HOB elevated   General bed mobility comments:  cuing for log rolling for supine>sit, bed mobility fluctuates depending on pt's LBP    Transfers Overall transfer level: Needs assistance Equipment used: Rolling walker (2 wheels) Transfers: Sit to/from Stand Sit to Stand: Contact guard assist, From elevated surface           General transfer comment: Cues for hand placement    Ambulation/Gait Ambulation/Gait assistance: Contact guard assist Gait Distance (Feet): 160 Feet Assistive device: Rolling walker (2 wheels) Gait Pattern/deviations: Decreased step length - right, Decreased step length - left, Decreased stride length, Step-through pattern, Trunk flexed       General Gait Details: Pt pushing RW out in front of him with poor ability to ambulate within base of AD despite ongoing cuing. Unable to fully stand upright, pt with c/o back pain.   Stairs             Wheelchair Mobility     Tilt Bed    Modified Rankin (Stroke Patients Only)       Balance Overall balance assessment: Needs assistance, History of Falls Sitting-balance support: Feet supported Sitting balance-Leahy Scale: Good Sitting balance - Comments: close supervision<>CGA static sitting EOB   Standing balance support: Bilateral upper extremity supported, During functional activity, Reliant on assistive device for balance Standing balance-Leahy Scale: Fair Standing balance comment: RW to offset pressure on lower back, hx of frequent falls at home                            Communication Communication Communication: No apparent difficulties  Cognition Arousal: Alert Behavior During Therapy: [facility] for tasks  assessed/performed   PT - Cognitive impairments: History of cognitive impairments, Orientation, Awareness, Memory, Problem solving, Safety/Judgement   Orientation impairments: Place                   PT - Cognition Comments: Pt with hx of Alzheimers Following commands: Impaired Following commands impaired: Only follows  one step commands consistently, Follows one step commands with increased time    Cueing Cueing Techniques: Verbal cues, Tactile cues, Visual cues  Exercises General Exercises - Lower Extremity Ankle Circles/Pumps: AROM, Both, 10 reps, Seated Long Arc Quad: AROM, Both, 10 reps, Seated Hip Flexion/Marching: AROM, Both, 10 reps, Seated, Other (comment) (modified due to LBP)    General Comments        Pertinent Vitals/Pain Pain Assessment Pain Assessment: Faces Faces Pain Scale: Hurts little more Pain Location: low back Pain Descriptors / Indicators: Discomfort, Grimacing, Guarding Pain Intervention(s): Limited activity within patient's tolerance    Home Living                          Prior Function            PT Goals (current goals can now be found in the care plan section) Acute Rehab PT Goals Patient Stated Goal: get better, decreased pain Progress towards PT goals: Progressing toward goals    Frequency    Min 1X/week      PT Plan      Co-evaluation              AM-PAC PT "6 Clicks" Mobility   Outcome Measure  Help needed turning from your back to your side while in a flat bed without using bedrails?: A Little Help needed moving from lying on your back to sitting on the side of a flat bed without using bedrails?: A Lot Help needed moving to and from a bed to a chair (including a wheelchair)?: A Lot Help needed standing up from a chair using your arms (e.g., wheelchair or bedside chair)?: A Little Help needed to walk in hospital room?: A Little Help needed climbing 3-5 steps with a railing? : A Lot 6 Click Score: 15    End of Session Equipment Utilized During Treatment: Gait belt Activity Tolerance: Patient tolerated treatment well Patient left: in chair;with call bell/phone within reach;with chair alarm set Nurse Communication: Mobility status PT Visit Diagnosis: Pain;Muscle weakness (generalized) (M62.81);Other abnormalities of gait and  mobility (R26.89);Unsteadiness on feet (R26.81);History of falling (Z91.81);Difficulty in walking, not elsewhere classified (R26.2) Pain - part of body:  (Low Back)     Time: 8182-9937 PT Time Calculation (min) (ACUTE ONLY): 39 min  Charges:    $Gait Training: 8-22 mins $Therapeutic Exercise: 8-22 mins $Therapeutic Activity: 8-22 mins PT General Charges $$ ACUTE PT VISIT: 1 Visit                    Zadie Cleverly, PTA  Jannet Askew 12/18/2023, 2:59 PM

## 2023-12-18 NOTE — TOC Progression Note (Addendum)
 Transition of Care [facility]) - Progression Note    Patient Details  Name: Ryan Hicks MRN: [id number] Date of Birth: 1950-11-30  Transition of Care [facility]) CM/SW Contact  Margarito Liner, LCSW Phone Number: 12/18/2023, 8:50 AM  Clinical Narrative:   CSW left message for [facility] admissions coordinator asking her to review referral.  11:31 am: [facility] is unable to offer a bed. Patient is aware and asked CSW to call his wife. CSW left her a voicemail. Patient has two other bed offers: [facility] and [address] and [facility]. CSW left the CMS ratings in the room for him to review.  12:29 pm: Wife sent CSW a text message stating she is at a doctors appt but received voicemail is is considering [facility]. She wants to call CSW to discuss before accepting. CSW left admissions coordinator a message to see how soon they would have a bed.  2:54 pm: Wife left CSW a voicemail stating that she does want to accept the offer from [facility]. CSW left the admissions coordinator another message to notify and see how soon they would have a bed.  3:50 pm: [facility] can accept patient tomorrow depending on bed availability (first come first serve), insurance authorization, and the weather. CSW started [place]. Wife is aware.  Expected Discharge Plan: Skilled Nursing Facility Barriers to Discharge: Continued Medical Work up  Expected Discharge Plan and Services       Living arrangements for the past 2 months: Single Family Home Expected Discharge Date: 12/15/23                                     Social Determinants of Health (SDOH) Interventions SDOH Screenings   Food Insecurity: No Food Insecurity (12/15/2023)  Housing: Low Risk  (12/15/2023)  Transportation Needs: No Transportation Needs (12/15/2023)  Utilities: Not At Risk (12/15/2023)  Social Connections: Unknown (12/15/2023)  Tobacco Use: Low Risk  (12/14/2023)  Recent Concern: Tobacco Use - High Risk (12/05/2023)    Received from [facility]    Readmission Risk Interventions     No data to display

## 2023-12-19 DIAGNOSIS — E1169 Type 2 diabetes mellitus with other specified complication: Secondary | ICD-10-CM | POA: Diagnosis not present

## 2023-12-19 DIAGNOSIS — Z794 Long term (current) use of insulin: Secondary | ICD-10-CM

## 2023-12-19 LAB — BASIC METABOLIC PANEL
Anion gap: 12 (ref 5–15)
BUN: 16 mg/dL (ref 8–23)
CO2: 25 mmol/L (ref 22–32)
Calcium: 8.2 mg/dL — ABNORMAL LOW (ref 8.9–10.3)
Chloride: 95 mmol/L — ABNORMAL LOW (ref 98–111)
Creatinine, Ser: 0.78 mg/dL (ref 0.61–1.24)
GFR, Estimated: >60 mL/min (ref >60)
Glucose, Bld: 170 mg/dL — ABNORMAL HIGH (ref 70–99)
Potassium: 3.5 mmol/L (ref 3.5–5.1)
Sodium: 132 mmol/L — ABNORMAL LOW (ref 135–145)

## 2023-12-19 LAB — GLUCOSE, CAPILLARY
Glucose-Capillary: 198 mg/dL — ABNORMAL HIGH (ref 70–99)
Glucose-Capillary: 247 mg/dL — ABNORMAL HIGH (ref 70–99)
Glucose-Capillary: 166 mg/dL — ABNORMAL HIGH (ref 70–99)
Glucose-Capillary: 155 mg/dL — ABNORMAL HIGH (ref 70–99)

## 2023-12-19 MED ORDER — BISACODYL 5 MG PO TBEC
10.0000 mg | DELAYED_RELEASE_TABLET | Freq: Every day | ORAL | Status: DC | PRN
Start: 2023-12-19 — End: 2023-12-24

## 2023-12-19 MED ORDER — AMLODIPINE BESYLATE 10 MG PO TABS
10.0000 mg | ORAL_TABLET | Freq: Every day | ORAL | Status: DC
  Administered 2023-12-19 – 2023-12-24 (×6): 10 mg via ORAL
  Filled 2023-12-19 (×6): qty 1

## 2023-12-19 MED ORDER — INSULIN ASPART 100 UNIT/ML IJ SOLN
6.0000 [IU] | Freq: Three times a day (TID) | INTRAMUSCULAR | Status: DC
  Administered 2023-12-19 – 2023-12-24 (×15): 6 [IU] via SUBCUTANEOUS
  Filled 2023-12-19 (×15): qty 1

## 2023-12-19 NOTE — Plan of Care (Signed)

## 2023-12-19 NOTE — TOC Progression Note (Signed)
 Transition of Care [facility]) - Progression Note    Patient Details  Name: Ryan Hicks MRN: [id number] Date of Birth: [birth year]  Transition of Care [facility]) CM/SW Contact  Chapman Fitch, RN Phone Number: 12/19/2023, 11:19 AM  Clinical Narrative:     Insurance Berkley Harvey is pending for [facility]  Expected Discharge Plan: Skilled Nursing Facility Barriers to Discharge: Continued Medical Work up  Expected Discharge Plan and Services       Living arrangements for the past 2 months: Single Family Home Expected Discharge Date: 12/15/23                                     Social Determinants of Health (SDOH) Interventions SDOH Screenings   Food Insecurity: No Food Insecurity (12/15/2023)  Housing: Low Risk  (12/15/2023)  Transportation Needs: No Transportation Needs (12/15/2023)  Utilities: Not At Risk (12/15/2023)  Social Connections: Unknown (12/15/2023)  Tobacco Use: Low Risk  (12/14/2023)  Recent Concern: Tobacco Use - High Risk (12/05/2023)   Received from [facility]    Readmission Risk Interventions     No data to display

## 2023-12-19 NOTE — Progress Notes (Signed)
 Physical Therapy Treatment Patient Details Name: Ryan Hicks MRN: [id number] DOB: 28-Jan-1951 Today's Date: 12/19/2023   History of Present Illness Pt is a 73 y/o M admitted on 12/14/23 after presenting with c/o DKA & fall. PMH: advanced dementia, HLD, CVA, heterozygous factor V leiden mutation, medical noncompliance    PT Comments  Pt received in bed, agreeable to PT session. Continues to require ModA for supine to sit due to LBP. Increased time needed for mobility due to frequent rest breaks. Sit to stand from raised bed with CGA at RW. Good tolerance for gait training in hallway with CGA. Pt ambulates with RW far out in front of him and flexed forward posture due to chronic LBP. Pt positioned to comfort in chair upon returning to room with all needs in reach. Continue PT acutely, awaiting STR prior to returning home with supportive wife.   If plan is discharge home, recommend the following: Assist for transportation;A lot of help with bathing/dressing/bathroom;Direct supervision/assist for financial management;Supervision due to cognitive status;Assistance with cooking/housework;Direct supervision/assist for medications management;Help with stairs or ramp for entrance;A little help with walking and/or transfers   Can travel by private vehicle     No  Equipment Recommendations  Other (comment)    Recommendations for Other Services       Precautions / Restrictions Precautions Precautions: Fall Restrictions Weight Bearing Restrictions Per Provider Order: No     Mobility  Bed Mobility Overal bed mobility: Needs Assistance Bed Mobility: Rolling, Sidelying to Sit Rolling: Min assist Sidelying to sit: Mod assist       General bed mobility comments: cuing for log rolling for supine>sit, bed mobility fluctuates depending on pt's LBP    Transfers Overall transfer level: Needs assistance Equipment used: Rolling walker (2 wheels) Transfers: Sit to/from Stand Sit to Stand: Contact  guard assist, From elevated surface           General transfer comment: Increased assistance needed from lower surfaces    Ambulation/Gait Ambulation/Gait assistance: Contact guard assist Gait Distance (Feet): 160 Feet Assistive device: Rolling walker (2 wheels) Gait Pattern/deviations: Decreased step length - right, Decreased step length - left, Decreased stride length, Step-through pattern, Trunk flexed Gait velocity: decreased     General Gait Details: Pt pushing RW out in front of him with poor ability to ambulate within base of AD despite ongoing cuing. Unable to fully stand upright, pt with c/o back pain. Secured gait belt to RW to keep pt inside RW which caused increased fatigue while ambulating   Stairs             Wheelchair Mobility     Tilt Bed    Modified Rankin (Stroke Patients Only)       Balance Overall balance assessment: Needs assistance, History of Falls Sitting-balance support: Feet supported Sitting balance-Leahy Scale: Good Sitting balance - Comments: able to sit EOB with supervision   Standing balance support: Bilateral upper extremity supported, During functional activity, Reliant on assistive device for balance Standing balance-Leahy Scale: Fair Standing balance comment: RW to offset pressure on lower back, hx of frequent falls at home                            Communication Communication Communication: No apparent difficulties  Cognition Arousal: Alert Behavior During Therapy: WFL for tasks assessed/performed   PT - Cognitive impairments: History of cognitive impairments, Orientation, Awareness, Memory, Problem solving, Safety/Judgement   Orientation impairments: Place  PT - Cognition Comments: Pt with hx of Alzheimers Following commands: Impaired Following commands impaired: Only follows one step commands consistently, Follows one step commands with increased time    Cueing Cueing  Techniques: Verbal cues, Tactile cues, Visual cues  Exercises General Exercises - Lower Extremity Ankle Circles/Pumps: AROM, Both, 10 reps, Seated Long Arc Quad: AROM, Both, 10 reps, Seated Hip Flexion/Marching: AROM, Both, 10 reps, Seated, Other (comment)    General Comments        Pertinent Vitals/Pain Pain Assessment Pain Assessment: Faces Faces Pain Scale: Hurts little more Pain Location: low back Pain Descriptors / Indicators: Discomfort, Grimacing, Guarding Pain Intervention(s): Limited activity within patient's tolerance    Home Living                          Prior Function            PT Goals (current goals can now be found in the care plan section) Acute Rehab PT Goals Patient Stated Goal: get better, decreased pain Progress towards PT goals: Progressing toward goals    Frequency    Min 1X/week      PT Plan      Co-evaluation              AM-PAC PT "6 Clicks" Mobility   Outcome Measure  Help needed turning from your back to your side while in a flat bed without using bedrails?: A Little Help needed moving from lying on your back to sitting on the side of a flat bed without using bedrails?: A Lot Help needed moving to and from a bed to a chair (including a wheelchair)?: A Lot Help needed standing up from a chair using your arms (e.g., wheelchair or bedside chair)?: A Little Help needed to walk in hospital room?: A Little Help needed climbing 3-5 steps with a railing? : A Lot 6 Click Score: 15    End of Session Equipment Utilized During Treatment: Gait belt Activity Tolerance: Patient tolerated treatment well Patient left: in chair;with call bell/phone within reach;with chair alarm set Nurse Communication: Mobility status PT Visit Diagnosis: Pain;Muscle weakness (generalized) (M62.81);Other abnormalities of gait and mobility (R26.89);Unsteadiness on feet (R26.81);History of falling (Z91.81);Difficulty in walking, not elsewhere  classified (R26.2) Pain - part of body:  (Back)     Time: 1914-7829 PT Time Calculation (min) (ACUTE ONLY): 39 min  Charges:    $Gait Training: 8-22 mins $Therapeutic Exercise: 8-22 mins $Therapeutic Activity: 8-22 mins PT General Charges $$ ACUTE PT VISIT: 1 Visit                    Zadie Cleverly, PTA  Jannet Askew 12/19/2023, 1:41 PM

## 2023-12-19 NOTE — Inpatient Diabetes Management (Signed)
 Inpatient Diabetes Program Recommendations  AACE/ADA: New Consensus Statement on Inpatient Glycemic Control   Target Ranges:  Prepandial:   less than 140 mg/dL      Peak postprandial:   less than 180 mg/dL (1-2 hours)      Critically ill patients:  140 - 180 mg/dL    Latest Reference Range & Units 12/18/23 08:37 12/18/23 12:16 12/18/23 16:47 12/18/23 21:06  Glucose-Capillary 70 - 99 mg/dL 401 (H) 027 (H) 253 (H) 171 (H)   Review of Glycemic Control  Diabetes history: DM2 Outpatient Diabetes medications: Actos 15 mg daily (only DM med listed on home med list; per PCP note on 12/13/23 pt is prescribed Glipizide 10 mg BID, Actos 15 mg daily, and Metformin 1000 mg BID for DM) Current orders for Inpatient glycemic control: Semglee 15 units daily, Novolog 0-15 units AC&HS, Novolog 3 units TID with meals   Inpatient Diabetes Program Recommendations:     Insulin: Please consider increasing meal coverage to Novolog 6 units TID with meals.  Thanks, Orlando Penner, RN, MSN, CDCES Diabetes Coordinator Inpatient Diabetes Program [phone] (Team Pager from 8am to 5pm)

## 2023-12-19 NOTE — Progress Notes (Signed)
 PROGRESS NOTE    Ryan Hicks  ZOX:[id number] DOB: 01-Mar-1951 DOA: 12/14/2023 PCP: Gracelyn Nurse, MD  Assessment & Plan:   Principal Problem:   DKA (diabetic ketoacidosis) (HCC) Active Problems:   Hypertension   Dementia without behavioral disturbance (HCC)   Fall   Hyperbilirubinemia  Assessment and Plan: DKA: resolved  DM2: poorly controlled, HbA1c 11.1. Continue on glargine, aspart, SSI w/ accuchecks    Hyperbilirubinemia: Ultrasound showed distended gallbladder w/ sludge & stones & borderline wall thickening. Will continue to monitor    Fall: PT/OT recs SNF    Dementia: without behavioral disturbance. Continue on home dose of donepezil. Continue w/ supportive care.    HTN: will start amlodipine.    PAF: currently rate controlled. Not on any rate controlling meds or chronic anticoagulation as per med rec. High fall risk.       DVT prophylaxis: lovenox  Code Status: full  Family Communication: Disposition Plan: d/c to SNF.   Level of care: Telemetry Medical  Status is: Observation The patient remains OBS appropriate and will d/c before 2 midnights. Waiting on insurance auth for SNF as per CM     Consultants:    Procedures:   Antimicrobials:    Subjective: Pt c/o fatigue   Objective: Vitals:   12/18/23 1643 12/18/23 1944 12/19/23 0400 12/19/23 0739  BP: (!) 140/87 (!) 171/93 (!) 153/88 (!) 166/92  Pulse: (!) 107 92 92 93  Resp: 16 18 18 18   Temp: 98 F (36.7 C) 98.1 F (36.7 C) 98.4 F (36.9 C) 98.5 F (36.9 C)  TempSrc: Oral Oral Oral   SpO2: 97% 99% 98% 98%  Weight:      Height:       No intake or output data in the 24 hours ending 12/19/23 0827 Filed Weights   12/14/23 1153  Weight: 96.2 kg    Examination:  General exam: Appears calm and comfortable  Respiratory system: Clear to auscultation. Respiratory effort normal. Cardiovascular system: S1 & S2+. No rubs, gallops or clicks.  Gastrointestinal system: Abdomen is  nondistended, soft and nontender. Normal bowel sounds heard. Central nervous system: Alert and oriented. Moves all extremities  Psychiatry: Judgement and insight appear normal. Flat mood and affect     Data Reviewed: I have personally reviewed following labs and imaging studies  CBC: Recent Labs  Lab 12/14/23 1155 12/15/23 0337 12/16/23 0810 12/17/23 0001 12/18/23 0606  WBC 12.8* 9.9 7.6 8.1 9.2  NEUTROABS  --   --  5.7 5.6 6.9  HGB 16.6 13.3 13.9 13.1 13.7  HCT 45.9 36.4* 37.7* 35.1* 37.5*  MCV 83.6 82.7 82.3 80.9 82.4  PLT 325 252 189 187 220   Basic Metabolic Panel: Recent Labs  Lab 12/14/23 1931 12/14/23 2340 12/15/23 1111 12/16/23 0810 12/17/23 0001 12/18/23 0606 12/19/23 0447  NA 133*   < > 135 133* 135 133* 132*  K 3.0*   < > 3.5 3.3* 3.2* 3.5 3.5  CL 97*   < > 105 100 99 98 95*  CO2 13*   < > 21* 23 26 25 25   GLUCOSE 216*   < > 210* 226* 178* 166* 170*  BUN 39*   < > 27* 18 17 14 16   CREATININE 1.22   < > 0.98 0.82 0.72 0.69 0.78  CALCIUM 9.1   < > 8.2* 8.1* 8.0* 8.1* 8.2*  MG 1.9  --   --   --   --   --   --    < > =  values in this interval not displayed.   GFR: Estimated Creatinine Clearance: 100.3 mL/min (by C-G formula based on SCr of 0.78 mg/dL). Liver Function Tests: Recent Labs  Lab 12/14/23 1155  AST 13*  ALT 12  ALKPHOS 78  BILITOT 2.1*  PROT 7.2  ALBUMIN 3.6   No results for input(s): "LIPASE", "AMYLASE" in the last 168 hours. No results for input(s): "AMMONIA" in the last 168 hours. Coagulation Profile: No results for input(s): "INR", "PROTIME" in the last 168 hours. Cardiac Enzymes: No results for input(s): "CKTOTAL", "CKMB", "CKMBINDEX", "TROPONINI" in the last 168 hours. BNP (last 3 results) No results for input(s): "PROBNP" in the last 8760 hours. HbA1C: No results for input(s): "HGBA1C" in the last 72 hours. CBG: Recent Labs  Lab 12/18/23 0837 12/18/23 1216 12/18/23 1647 12/18/23 2106 12/19/23 0740  GLUCAP 200* 253*  244* 171* 198*   Lipid Profile: No results for input(s): "CHOL", "HDL", "LDLCALC", "TRIG", "CHOLHDL", "LDLDIRECT" in the last 72 hours. Thyroid Function Tests: No results for input(s): "TSH", "T4TOTAL", "FREET4", "T3FREE", "THYROIDAB" in the last 72 hours. Anemia Panel: No results for input(s): "VITAMINB12", "FOLATE", "FERRITIN", "TIBC", "IRON", "RETICCTPCT" in the last 72 hours. Sepsis Labs: No results for input(s): "PROCALCITON", "LATICACIDVEN" in the last 168 hours.  No results found for this or any previous visit (from the past 240 hours).       Radiology Studies: No results found.      Scheduled Meds:  donepezil  10 mg Oral Daily   enoxaparin (LOVENOX) injection  40 mg Subcutaneous Q24H   feeding supplement (GLUCERNA SHAKE)  237 mL Oral BID BM   insulin aspart  0-15 Units Subcutaneous TID AC & HS   insulin aspart  6 Units Subcutaneous TID WC   insulin glargine-yfgn  15 Units Subcutaneous Daily   multivitamin with minerals  1 tablet Oral Daily   Ensure Max Protein  11 oz Oral QHS   tamsulosin  0.4 mg Oral Daily   Continuous Infusions:  lactated ringers       LOS: 0 days       Charise Killian, MD [facility] Pager [phone]  If 7PM-7AM, please contact night-coverage [email or web address] 12/19/2023, 8:27 AM

## 2023-12-19 NOTE — Progress Notes (Signed)
 Occupational Therapy Treatment Patient Details Name: Ryan Hicks MRN: [id number] DOB: 08-22-1951 Today's Date: 12/19/2023   History of present illness Pt is a 73 y/o M admitted on 12/14/23 after presenting with c/o DKA & fall. PMH: advanced dementia, HLD, CVA, heterozygous factor V leiden mutation, medical noncompliance   OT comments  Pt asleep, easily awakens to voice. Pleasant and talkative however at time is difficult to redirect to task. Performs bed mobility with log roll technique due to c/o LBP, min-modA. Pt initially agreeable to perform ADLs and transfer to recliner, but reports increased back pain while sitting EOB, and spontaneously returns to supine. RN notified of pt's requests for pain meds. Pt performs bed level ADLs, left with needs in reach and alarm activated. Discharge recommendation appropriate, Patient will benefit from continued inpatient follow up therapy, <3 hours/day       If plan is discharge home, recommend the following:  A little help with walking and/or transfers;A little help with bathing/dressing/bathroom;Supervision due to cognitive status;Help with stairs or ramp for entrance   Equipment Recommendations  BSC/3in1       Precautions / Restrictions Precautions Precautions: Fall Restrictions Weight Bearing Restrictions Per Provider Order: No       Mobility Bed Mobility Overal bed mobility: Needs Assistance Bed Mobility: Rolling, Sidelying to Sit Rolling: Min assist Sidelying to sit: Mod assist   Sit to supine: Min assist, HOB elevated   General bed mobility comments: cuing for log rolling for supine>sit, bed mobility fluctuates depending on pt's LBP    Transfers                   General transfer comment: Not performed, pt refusing to attempt due to LBP     Balance Overall balance assessment: Needs assistance, History of Falls Sitting-balance support: Feet supported Sitting balance-Leahy Scale: Good Sitting balance - Comments: able  to sit EOB with supervision                                   ADL either performed or assessed with clinical judgement   ADL Overall ADL's : Needs assistance/impaired     Grooming: Wash/dry face;Bed level                                 General ADL Comments: OT attempts to progress ADL performance - pt transitoning to seated EOB and attempting to work on standing ADLs/transfers with pt reporting increase LBP, difficult to redirect. Tolerates sitting EOB, initally agrees to try to transfer to recliner, but spontaneously returns to supine.     Communication Communication Communication: No apparent difficulties   Cognition Arousal: Alert Behavior During Therapy: WFL for tasks assessed/performed Cognition: Cognition impaired       Memory impairment (select all impairments): Short-term memory     OT - Cognition Comments: pt talkative; difficult to redirect                 Following commands: Impaired Following commands impaired: Only follows one step commands consistently, Follows one step commands with increased time      Cueing   Cueing Techniques: Verbal cues, Tactile cues, Visual cues             Pertinent Vitals/ Pain       Pain Assessment Pain Assessment: Faces Faces Pain Scale: Hurts little more Pain Location: low  back Pain Descriptors / Indicators: Discomfort, Grimacing, Guarding Pain Intervention(s): Limited activity within patient's tolerance, Monitored during session, Patient requesting pain meds-RN notified   Frequency  Min 1X/week        Progress Toward Goals  OT Goals(current goals can now be found in the care plan section)  Progress towards OT goals: Progressing toward goals  Acute Rehab OT Goals OT Goal Formulation: With patient/family Time For Goal Achievement: 12/30/23 Potential to Achieve Goals: Good ADL Goals Pt Will Perform Grooming: with modified independence;standing Pt Will Perform Lower Body  Dressing: with modified independence;sit to/from stand Pt Will Transfer to Toilet: with modified independence;ambulating;regular height toilet  Plan         AM-PAC OT "6 Clicks" Daily Activity     Outcome Measure   Help from another person eating meals?: None Help from another person taking care of personal grooming?: A Little Help from another person toileting, which includes using toliet, bedpan, or urinal?: A Little Help from another person bathing (including washing, rinsing, drying)?: A Lot Help from another person to put on and taking off regular upper body clothing?: A Little Help from another person to put on and taking off regular lower body clothing?: A Lot 6 Click Score: 17    End of Session Equipment Utilized During Treatment: Rolling walker (2 wheels);Gait belt  OT Visit Diagnosis: Other abnormalities of gait and mobility (R26.89);Muscle weakness (generalized) (M62.81)   Activity Tolerance Patient limited by pain   Patient Left in bed;with call bell/phone within reach;with bed alarm set   Nurse Communication Patient requests pain meds        Time: 1610-9604 OT Time Calculation (min): 13 min  Charges: OT General Charges $OT Visit: 1 Visit OT Treatments $Self Care/Home Management : 8-22 mins  Herbert Aguinaldo L. Mishelle Hassan, OTR/L  12/19/23, 9:47 AM

## 2023-12-20 DIAGNOSIS — Z794 Long term (current) use of insulin: Secondary | ICD-10-CM | POA: Diagnosis not present

## 2023-12-20 DIAGNOSIS — E1169 Type 2 diabetes mellitus with other specified complication: Secondary | ICD-10-CM | POA: Diagnosis not present

## 2023-12-20 LAB — COMPREHENSIVE METABOLIC PANEL
ALT: 13 U/L (ref 0–44)
AST: 17 U/L (ref 15–41)
Albumin: 2.6 g/dL — ABNORMAL LOW (ref 3.5–5.0)
Alkaline Phosphatase: 87 U/L (ref 38–126)
Anion gap: 11 (ref 5–15)
BUN: 16 mg/dL (ref 8–23)
CO2: 28 mmol/L (ref 22–32)
Calcium: 7.9 mg/dL — ABNORMAL LOW (ref 8.9–10.3)
Chloride: 94 mmol/L — ABNORMAL LOW (ref 98–111)
Creatinine, Ser: 0.73 mg/dL (ref 0.61–1.24)
GFR, Estimated: >60 mL/min (ref >60)
Glucose, Bld: 126 mg/dL — ABNORMAL HIGH (ref 70–99)
Potassium: 3 mmol/L — ABNORMAL LOW (ref 3.5–5.1)
Sodium: 133 mmol/L — ABNORMAL LOW (ref 135–145)
Total Bilirubin: 1 mg/dL (ref 0.0–1.2)
Total Protein: 5.2 g/dL — ABNORMAL LOW (ref 6.5–8.1)

## 2023-12-20 LAB — GLUCOSE, CAPILLARY
Glucose-Capillary: 160 mg/dL — ABNORMAL HIGH (ref 70–99)
Glucose-Capillary: 187 mg/dL — ABNORMAL HIGH (ref 70–99)
Glucose-Capillary: 139 mg/dL — ABNORMAL HIGH (ref 70–99)
Glucose-Capillary: 103 mg/dL — ABNORMAL HIGH (ref 70–99)

## 2023-12-20 MED ORDER — POLYETHYLENE GLYCOL 3350 17 G PO PACK
17.0000 g | PACK | Freq: Every day | ORAL | Status: DC
  Administered 2023-12-20 – 2023-12-23 (×4): 17 g via ORAL
  Filled 2023-12-20 (×5): qty 1

## 2023-12-20 MED ORDER — DOCUSATE SODIUM 100 MG PO CAPS
200.0000 mg | ORAL_CAPSULE | Freq: Two times a day (BID) | ORAL | Status: DC
  Administered 2023-12-20 – 2023-12-24 (×7): 200 mg via ORAL
  Filled 2023-12-20 (×8): qty 2

## 2023-12-20 MED ORDER — POTASSIUM CHLORIDE CRYS ER 20 MEQ PO TBCR
40.0000 meq | EXTENDED_RELEASE_TABLET | Freq: Two times a day (BID) | ORAL | Status: AC
Start: 2023-12-20 — End: 2023-12-20
  Administered 2023-12-20 (×2): 40 meq via ORAL
  Filled 2023-12-20 (×2): qty 2

## 2023-12-20 NOTE — TOC Progression Note (Addendum)
 Transition of Care [facility]) - Progression Note    Patient Details  Name: Ryan Hicks MRN: [id number] Date of Birth: 09-Jan-1951  [facility] CM/SW Contact  Chapman Fitch, RN Phone Number: 12/20/2023, 10:56 AM  Clinical Narrative:     Peer to peer was completed this morning Received Denial from [facility] for [facility] Wife Joy notified.  She is to call for fast track appeal at [phone].    Update Received notification from Joy that she has submitted the appeal.  Reference number [id number].  I have faxed MD note from today, and PT note from yesterday  Ander Slade is to reach out to the business office at [employer] to discuss private pay Expected Discharge Plan: Skilled Nursing Facility Barriers to Discharge: Continued Medical Work up  Expected Discharge Plan and Services       Living arrangements for the past 2 months: Single Family Home Expected Discharge Date: 12/15/23                                     Social Determinants of Health (SDOH) Interventions SDOH Screenings   Food Insecurity: No Food Insecurity (12/15/2023)  Housing: Low Risk  (12/15/2023)  Transportation Needs: No Transportation Needs (12/15/2023)  Utilities: Not At Risk (12/15/2023)  Social Connections: Unknown (12/15/2023)  Tobacco Use: Low Risk  (12/14/2023)  Recent Concern: Tobacco Use - High Risk (12/05/2023)   Received from [facility]    Readmission Risk Interventions     No data to display

## 2023-12-20 NOTE — TOC Progression Note (Signed)
 Transition of Care [facility]) - Progression Note    Patient Details  Name: Ryan Hicks MRN: [id number] Date of Birth: [birth year]  [facility] CM/SW Contact  Chapman Fitch, RN Phone Number: 12/20/2023, 8:47 AM  Clinical Narrative:    Per Vesta Mixer still pending for [facility]   Expected Discharge Plan: Skilled Nursing Facility Barriers to Discharge: Continued Medical Work up  Expected Discharge Plan and Services       Living arrangements for the past 2 months: Single Family Home Expected Discharge Date: 12/15/23                                     Social Determinants of Health (SDOH) Interventions SDOH Screenings   Food Insecurity: No Food Insecurity (12/15/2023)  Housing: Low Risk  (12/15/2023)  Transportation Needs: No Transportation Needs (12/15/2023)  Utilities: Not At Risk (12/15/2023)  Social Connections: Unknown (12/15/2023)  Tobacco Use: Low Risk  (12/14/2023)  Recent Concern: Tobacco Use - High Risk (12/05/2023)   Received from [facility]    Readmission Risk Interventions     No data to display

## 2023-12-20 NOTE — Progress Notes (Signed)
 OT Cancellation Note  Patient Details Name: Ryan Hicks MRN: [id number] DOB: 1950/12/11   Cancelled Treatment:    Reason Eval/Treat Not Completed: Other (comment); pt declined any ADLs or OOB activity.  Spouse present and confirmed that pt had not been out of bed since at least 11:30 this morning, despite pt insisting he had just gotten back in bed.  OT provided additional encouragement for OOB activities, but pt declined and requested to check in tomorrow.    Danelle Earthly, MS, OTR/L   Otis Dials 12/20/2023, 3:22 PM

## 2023-12-20 NOTE — Progress Notes (Addendum)
 Nutrition Follow-up  DOCUMENTATION CODES:   Not applicable  INTERVENTION:   Glucerna Shake po BID, each supplement provides 220 kcal and 10 grams of protein  Ensure Max protein supplement po daily, each supplement provides 150kcal and 30g of protein.  MVI po daily   Daily weights  Pt remains at refeed risk; recommend monitor potassium, magnesium and phosphorus labs daily until stable  NUTRITION DIAGNOSIS:   Increased nutrient needs related to chronic illness as evidenced by estimated needs. -ongoing  GOAL:   Patient will meet greater than or equal to 90% of their needs -progressing   MONITOR:   PO intake, Supplement acceptance, Labs, Weight trends, Skin, I & O's  ASSESSMENT:   74 y/o male with h/o dementia, HTN, DM, BPH, CVA, HLD and kidney stones who is admitted with DKA.  RD working remotely.  Per wife report, pt with ongoing poor appetite and oral intake. Per RN, pt did eat 100% of his breakfast this morning and was working on his lunch. Pt is being offered two different supplements. No BM noted since 2/13; bowel regimen added today. No new weight since admission; will request daily weights. Pt is currently pending SNF placement.    Medications reviewed and include: lovenox, insulin, MVI, KCl  Labs reviewed: Na 133(L), K 3.0(L) Cbgs- 187, 160 x 24hrs  NUTRITION - FOCUSED PHYSICAL EXAM: Unable to perform at this time   Diet Order:   Diet Order             Diet Carb Modified Fluid consistency: Thin; Room service appropriate? Yes with Assist  Diet effective now                  EDUCATION NEEDS:   No education needs have been identified at this time  Skin:  Skin Assessment: Reviewed RN Assessment  Last BM:  12/13/23  Height:   Ht Readings from Last 1 Encounters:  12/14/23 6' (1.829 m)    Weight:   Wt Readings from Last 1 Encounters:  12/14/23 96.2 kg    Ideal Body Weight:  80.9 kg  BMI:  Body mass index is 28.75 kg/m.  Estimated  Nutritional Needs:   Kcal:  2000-2300kcal/day  Protein:  100-115g/day  Fluid:  2.0-2.3L/day  Betsey Holiday MS, RD, LDN If unable to be reached, please send secure chat to "RD inpatient" available from 8:00a-4:00p daily

## 2023-12-20 NOTE — Plan of Care (Signed)

## 2023-12-20 NOTE — Progress Notes (Signed)
 Physical Therapy Treatment Patient Details Name: Ryan Hicks MRN: [id number] DOB: 04/15/1951 Today's Date: 12/20/2023   History of Present Illness Pt is a 73 y/o M admitted on 12/14/23 after presenting with c/o DKA & fall. PMH: advanced dementia, HLD, CVA, heterozygous factor V leiden mutation, medical noncompliance    PT Comments  Therapist in to see pt this pm, wife at bedside. Pt fatigued today and declining OOB activity due to feeling exhausted and thinking that he had already been up and mobilizing earlier (hx of Alzheimer's Dz). Long discussion with pt and wife while completing LE ROM exercises regarding therapist and wife's concern of recent STR denial. Pt currently requires extensive assist to mobilize from supine to sitting EOB with bed flat and absence of rails. Pt's strength deficits and chronic LBP require increased time to complete. The height of surface pt stands from also needs to be significantly elevated in order for him to power up to standing, otherwise ModA is required rise to standing at RW. Pt remains a high fall risk due to poor utilization of RW and flexed forward posture on level surfaces. Pt has also had several falls at home. Pt has steps to enter home and bathroom is on second floor which he is unable to negotiate at this time. Initial recommendations for STR prior to returning home with supportive wife remain highly appropriate as pt is not currently at his functional baseline. Pt would not be safe to d/c home until strength improves and pt is able to perform mobility more independently. Will continue to progress acutely until pt able to transition to STR.   If plan is discharge home, recommend the following: Assist for transportation;A lot of help with bathing/dressing/bathroom;Direct supervision/assist for financial management;Supervision due to cognitive status;Assistance with cooking/housework;Direct supervision/assist for medications management;Help with stairs or ramp  for entrance;A little help with walking and/or transfers   Can travel by private vehicle     No  Equipment Recommendations  Other (comment) (TBD at next level of care)    Recommendations for Other Services       Precautions / Restrictions Precautions Precautions: Fall Restrictions Weight Bearing Restrictions Per Provider Order: No     Mobility  Bed Mobility               General bed mobility comments: Pt requires extensive assist to transfer out of a regular flat bed    Transfers                   General transfer comment: Increased assistance needed from lower surfaces    Ambulation/Gait               General Gait Details: Remains a high fall risk, declined gait this date due to thinking he had already been up moving (hx of Alzheimers)   Stairs             Wheelchair Mobility     Tilt Bed    Modified Rankin (Stroke Patients Only)       Balance                                            Communication    Cognition Arousal: Alert, Lethargic Behavior During Therapy:  (Fatigued with limited tolerance for activity)   PT - Cognitive impairments: History of cognitive impairments, Orientation, Awareness, Memory, Problem solving, Safety/Judgement  Orientation impairments: Place                   PT - Cognition Comments: Pt with hx of Alzheimers Following commands: Impaired Following commands impaired: Only follows one step commands consistently, Follows one step commands with increased time    Cueing Cueing Techniques: Verbal cues, Tactile cues, Visual cues  Exercises General Exercises - Lower Extremity Ankle Circles/Pumps: AROM, Both, 10 reps, Seated Heel Slides: AROM, Both, 5 reps, Supine Hip ABduction/ADduction: AROM, Both, 5 reps, Supine    General Comments        Pertinent Vitals/Pain Pain Assessment Pain Assessment: Faces Faces Pain Scale: Hurts little more Pain Location: low back Pain  Descriptors / Indicators: Discomfort, Grimacing, Guarding Pain Intervention(s): Limited activity within patient's tolerance    Home Living                          Prior Function            PT Goals (current goals can now be found in the care plan section) Acute Rehab PT Goals Patient Stated Goal: get better, decreased pain    Frequency    Min 1X/week      PT Plan      Co-evaluation              AM-PAC PT "6 Clicks" Mobility   Outcome Measure  Help needed turning from your back to your side while in a flat bed without using bedrails?: A Little Help needed moving from lying on your back to sitting on the side of a flat bed without using bedrails?: A Lot Help needed moving to and from a bed to a chair (including a wheelchair)?: A Lot Help needed standing up from a chair using your arms (e.g., wheelchair or bedside chair)?: A Little Help needed to walk in hospital room?: A Little Help needed climbing 3-5 steps with a railing? : A Lot 6 Click Score: 15    End of Session   Activity Tolerance: Patient limited by fatigue Patient left: in bed;with call bell/phone within reach;with bed alarm set;with family/visitor present Nurse Communication: Mobility status PT Visit Diagnosis: Pain;Muscle weakness (generalized) (M62.81);Other abnormalities of gait and mobility (R26.89);Unsteadiness on feet (R26.81);History of falling (Z91.81);Difficulty in walking, not elsewhere classified (R26.2) Pain - part of body:  (Lower back)     Time: 1914-7829 PT Time Calculation (min) (ACUTE ONLY): 16 min  Charges:    $Therapeutic Exercise: 8-22 mins PT General Charges $$ ACUTE PT VISIT: 1 Visit                    Zadie Cleverly, PTA  Jannet Askew 12/20/2023, 3:24 PM

## 2023-12-20 NOTE — Progress Notes (Addendum)
 PROGRESS NOTE    Ryan Hicks  UJW:[id number] DOB: Jan 20, 1951 DOA: 12/14/2023 PCP: Gracelyn Nurse, MD  Assessment & Plan:   Principal Problem:   DKA (diabetic ketoacidosis) (HCC) Active Problems:   Hypertension   Dementia without behavioral disturbance (HCC)   Fall   Hyperbilirubinemia  Assessment and Plan: DKA: resolved  DM2: poorly controlled, HbA1c 11.1. Continue on glargine, aspart, SSI w/ accuchecks    Hyperbilirubinemia: Ultrasound showed distended gallbladder w/ sludge & stones & borderline wall thickening. Resolved    Fall: PT/OT recs SNF. Pt's insurance denied SNF even after peer to peer. Pt's wife is appealing the denial    Dementia: without behavioral disturbance. Continue on home dose of donepezil. Continue w/ supportive care    HTN: continue on amlodipine   PAF: currently rate controlled. Not on any rate controlling meds or chronic anticoagulation as per med rec. High fall risk. Discussed the pros vs cons on starting anticoagulation and pt wants to think this over       DVT prophylaxis: lovenox  Code Status: full  Family Communication: Disposition Plan: unclear, pt's insurance denied SNF   Level of care: Telemetry Medical  Status is: Observation The patient remains OBS appropriate and will d/c before 2 midnights. Pt's insurance denied SNF despite peer to peer and pt's wife is appealing the denial     Consultants:    Procedures:   Antimicrobials:    Subjective: Pt c/o malaise   Objective: Vitals:   12/19/23 1658 12/19/23 1923 12/20/23 0421 12/20/23 0807  BP: (!) 145/79 131/86 (!) 140/79 (!) 141/96  Pulse: 95 99 86 93  Resp: 16 20 16    Temp: 98.5 F (36.9 C) 98.2 F (36.8 C) 98 F (36.7 C) 98.1 F (36.7 C)  TempSrc: Oral Oral Oral Oral  SpO2: 97% 99% 96% 99%  Weight:      Height:        Intake/Output Summary (Last 24 hours) at 12/20/2023 0906 Last data filed at 12/20/2023 0420 Gross per 24 hour  Intake --  Output 525 ml  Net  -525 ml   Filed Weights   12/14/23 1153  Weight: 96.2 kg    Examination:  General exam:Appears comfortable  Respiratory system: clear breath sounds b/l  Cardiovascular system: S1/S2+. No rubs or clicks Gastrointestinal system: abd is soft, NT, ND & normal bowel sounds  Central nervous system:alert & awake. Moves all extremities  Psychiatry: Judgement and insight appears poor. Flat mood and affect    Data Reviewed: I have personally reviewed following labs and imaging studies  CBC: Recent Labs  Lab 12/14/23 1155 12/15/23 0337 12/16/23 0810 12/17/23 0001 12/18/23 0606  WBC 12.8* 9.9 7.6 8.1 9.2  NEUTROABS  --   --  5.7 5.6 6.9  HGB 16.6 13.3 13.9 13.1 13.7  HCT 45.9 36.4* 37.7* 35.1* 37.5*  MCV 83.6 82.7 82.3 80.9 82.4  PLT 325 252 189 187 220   Basic Metabolic Panel: Recent Labs  Lab 12/14/23 1931 12/14/23 2340 12/16/23 0810 12/17/23 0001 12/18/23 0606 12/19/23 0447 12/20/23 0506  NA 133*   < > 133* 135 133* 132* 133*  K 3.0*   < > 3.3* 3.2* 3.5 3.5 3.0*  CL 97*   < > 100 99 98 95* 94*  CO2 13*   < > 23 26 25 25 28   GLUCOSE 216*   < > 226* 178* 166* 170* 126*  BUN 39*   < > 18 17 14 16 16   CREATININE 1.22   < >  0.82 0.72 0.69 0.78 0.73  CALCIUM 9.1   < > 8.1* 8.0* 8.1* 8.2* 7.9*  MG 1.9  --   --   --   --   --   --    < > = values in this interval not displayed.   GFR: Estimated Creatinine Clearance: 100.3 mL/min (by C-G formula based on SCr of 0.73 mg/dL). Liver Function Tests: Recent Labs  Lab 12/14/23 1155 12/20/23 0506  AST 13* 17  ALT 12 13  ALKPHOS 78 87  BILITOT 2.1* 1.0  PROT 7.2 5.2*  ALBUMIN 3.6 2.6*   No results for input(s): "LIPASE", "AMYLASE" in the last 168 hours. No results for input(s): "AMMONIA" in the last 168 hours. Coagulation Profile: No results for input(s): "INR", "PROTIME" in the last 168 hours. Cardiac Enzymes: No results for input(s): "CKTOTAL", "CKMB", "CKMBINDEX", "TROPONINI" in the last 168 hours. BNP (last 3  results) No results for input(s): "PROBNP" in the last 8760 hours. HbA1C: No results for input(s): "HGBA1C" in the last 72 hours. CBG: Recent Labs  Lab 12/19/23 0740 12/19/23 1153 12/19/23 1657 12/19/23 2037 12/20/23 0807  GLUCAP 198* 247* 166* 155* 160*   Lipid Profile: No results for input(s): "CHOL", "HDL", "LDLCALC", "TRIG", "CHOLHDL", "LDLDIRECT" in the last 72 hours. Thyroid Function Tests: No results for input(s): "TSH", "T4TOTAL", "FREET4", "T3FREE", "THYROIDAB" in the last 72 hours. Anemia Panel: No results for input(s): "VITAMINB12", "FOLATE", "FERRITIN", "TIBC", "IRON", "RETICCTPCT" in the last 72 hours. Sepsis Labs: No results for input(s): "PROCALCITON", "LATICACIDVEN" in the last 168 hours.  No results found for this or any previous visit (from the past 240 hours).       Radiology Studies: No results found.      Scheduled Meds:  amLODipine  10 mg Oral Daily   donepezil  10 mg Oral Daily   enoxaparin (LOVENOX) injection  40 mg Subcutaneous Q24H   feeding supplement (GLUCERNA SHAKE)  237 mL Oral BID BM   insulin aspart  0-15 Units Subcutaneous TID AC & HS   insulin aspart  6 Units Subcutaneous TID WC   insulin glargine-yfgn  15 Units Subcutaneous Daily   multivitamin with minerals  1 tablet Oral Daily   potassium chloride  40 mEq Oral BID   Ensure Max Protein  11 oz Oral QHS   tamsulosin  0.4 mg Oral Daily   Continuous Infusions:  lactated ringers       LOS: 0 days       Charise Killian, MD [facility] Pager [phone]  If 7PM-7AM, please contact night-coverage [email or web address] 12/20/2023, 9:06 AM

## 2023-12-21 DIAGNOSIS — E1169 Type 2 diabetes mellitus with other specified complication: Secondary | ICD-10-CM | POA: Diagnosis not present

## 2023-12-21 DIAGNOSIS — Z794 Long term (current) use of insulin: Secondary | ICD-10-CM | POA: Diagnosis not present

## 2023-12-21 LAB — COMPREHENSIVE METABOLIC PANEL
ALT: 14 U/L (ref 0–44)
AST: 17 U/L (ref 15–41)
Albumin: 2.7 g/dL — ABNORMAL LOW (ref 3.5–5.0)
Alkaline Phosphatase: 94 U/L (ref 38–126)
Anion gap: 11 (ref 5–15)
BUN: 16 mg/dL (ref 8–23)
CO2: 25 mmol/L (ref 22–32)
Calcium: 8.2 mg/dL — ABNORMAL LOW (ref 8.9–10.3)
Chloride: 95 mmol/L — ABNORMAL LOW (ref 98–111)
Creatinine, Ser: 0.81 mg/dL (ref 0.61–1.24)
GFR, Estimated: >60 mL/min (ref >60)
Glucose, Bld: 160 mg/dL — ABNORMAL HIGH (ref 70–99)
Potassium: 4 mmol/L (ref 3.5–5.1)
Sodium: 131 mmol/L — ABNORMAL LOW (ref 135–145)
Total Bilirubin: 1.1 mg/dL (ref 0.0–1.2)
Total Protein: 5.3 g/dL — ABNORMAL LOW (ref 6.5–8.1)

## 2023-12-21 LAB — GLUCOSE, CAPILLARY
Glucose-Capillary: 165 mg/dL — ABNORMAL HIGH (ref 70–99)
Glucose-Capillary: 188 mg/dL — ABNORMAL HIGH (ref 70–99)
Glucose-Capillary: 172 mg/dL — ABNORMAL HIGH (ref 70–99)
Glucose-Capillary: 147 mg/dL — ABNORMAL HIGH (ref 70–99)

## 2023-12-21 LAB — MAGNESIUM: Magnesium: 1.7 mg/dL (ref 1.7–2.4)

## 2023-12-21 LAB — PHOSPHORUS: Phosphorus: 3.5 mg/dL (ref 2.5–4.6)

## 2023-12-21 NOTE — Progress Notes (Signed)
 PROGRESS NOTE    Ryan Hicks  XBJ:[id number] DOB: Apr 23, 1951 DOA: 12/14/2023 PCP: Gracelyn Nurse, MD  Assessment & Plan:   Principal Problem:   DKA (diabetic ketoacidosis) (HCC) Active Problems:   Hypertension   Dementia without behavioral disturbance (HCC)   Fall   Hyperbilirubinemia  Assessment and Plan: DKA: resolved  DM2: poorly controlled, HbA1c 11.1. Continue on glargine, aspart, SSI w/ accuchecks    Hyperbilirubinemia: Ultrasound showed distended gallbladder w/ sludge & stones & borderline wall thickening. Resolved    Fall: PT/OT recs SNF. Pt's insurance denied SNF even after peer to peer. Pt's wife appealed the d/c    Dementia: without behavioral disturbance. Continue on home dose of donepezil. Continue w/ supportive care    HTN: continue on CCB    PAF: currently rate controlled. Not on any rate controlling meds or chronic anticoagulation as per med rec. High fall risk. Discussed the pros vs cons on starting anticoagulation and pt wants to think this over       DVT prophylaxis: lovenox  Code Status: full  Family Communication: Disposition Plan: unclear, pt's insurance denied SNF and pt's wife filed an appeal   Level of care: Telemetry Medical  Status is: Observation The patient remains OBS appropriate and will d/c before 2 midnights. Pt's insurance denied SNF despite peer to peer and pt's wife filed an appeal     Consultants:    Procedures:   Antimicrobials:    Subjective: Pt c/o malaise   Objective: Vitals:   12/20/23 2014 12/21/23 0359 12/21/23 0500 12/21/23 0815  BP: 139/78 (!) 146/90  (!) 144/85  Pulse: 95 97  93  Resp: 20 16  18   Temp: 98.7 F (37.1 C) 98.3 F (36.8 C)  98.1 F (36.7 C)  TempSrc: Oral Oral  Oral  SpO2: 97% 96%  97%  Weight:   87.9 kg   Height:        Intake/Output Summary (Last 24 hours) at 12/21/2023 0828 Last data filed at 12/21/2023 3086 Gross per 24 hour  Intake 120 ml  Output 700 ml  Net -580 ml    Filed Weights   12/14/23 1153 12/21/23 0500  Weight: 96.2 kg 87.9 kg    Examination:  General exam: Appears calm & comfortable  Respiratory system: clear breath sounds b/l  Cardiovascular system: S1 & S2+. No rubs or gallops  Gastrointestinal system: abd is soft, NT, ND & normal bowel sounds  Central nervous system: alert & awake. Moves all extremities  Psychiatry: Judgement and insight appears poor. Appropriate mood and affect    Data Reviewed: I have personally reviewed following labs and imaging studies  CBC: Recent Labs  Lab 12/14/23 1155 12/15/23 0337 12/16/23 0810 12/17/23 0001 12/18/23 0606  WBC 12.8* 9.9 7.6 8.1 9.2  NEUTROABS  --   --  5.7 5.6 6.9  HGB 16.6 13.3 13.9 13.1 13.7  HCT 45.9 36.4* 37.7* 35.1* 37.5*  MCV 83.6 82.7 82.3 80.9 82.4  PLT 325 252 189 187 220   Basic Metabolic Panel: Recent Labs  Lab 12/14/23 1931 12/14/23 2340 12/17/23 0001 12/18/23 0606 12/19/23 0447 12/20/23 0506 12/21/23 0513  NA 133*   < > 135 133* 132* 133* 131*  K 3.0*   < > 3.2* 3.5 3.5 3.0* 4.0  CL 97*   < > 99 98 95* 94* 95*  CO2 13*   < > 26 25 25 28 25   GLUCOSE 216*   < > 178* 166* 170* 126* 160*  BUN  39*   < > 17 14 16 16 16   CREATININE 1.22   < > 0.72 0.69 0.78 0.73 0.81  CALCIUM 9.1   < > 8.0* 8.1* 8.2* 7.9* 8.2*  MG 1.9  --   --   --   --   --  1.7  PHOS  --   --   --   --   --   --  3.5   < > = values in this interval not displayed.   GFR: Estimated Creatinine Clearance: 90.5 mL/min (by C-G formula based on SCr of 0.81 mg/dL). Liver Function Tests: Recent Labs  Lab 12/14/23 1155 12/20/23 0506 12/21/23 0513  AST 13* 17 17  ALT 12 13 14   ALKPHOS 78 87 94  BILITOT 2.1* 1.0 1.1  PROT 7.2 5.2* 5.3*  ALBUMIN 3.6 2.6* 2.7*   No results for input(s): "LIPASE", "AMYLASE" in the last 168 hours. No results for input(s): "AMMONIA" in the last 168 hours. Coagulation Profile: No results for input(s): "INR", "PROTIME" in the last 168 hours. Cardiac  Enzymes: No results for input(s): "CKTOTAL", "CKMB", "CKMBINDEX", "TROPONINI" in the last 168 hours. BNP (last 3 results) No results for input(s): "PROBNP" in the last 8760 hours. HbA1C: No results for input(s): "HGBA1C" in the last 72 hours. CBG: Recent Labs  Lab 12/19/23 2037 12/20/23 0807 12/20/23 1239 12/20/23 1611 12/20/23 2105  GLUCAP 155* 160* 187* 139* 103*   Lipid Profile: No results for input(s): "CHOL", "HDL", "LDLCALC", "TRIG", "CHOLHDL", "LDLDIRECT" in the last 72 hours. Thyroid Function Tests: No results for input(s): "TSH", "T4TOTAL", "FREET4", "T3FREE", "THYROIDAB" in the last 72 hours. Anemia Panel: No results for input(s): "VITAMINB12", "FOLATE", "FERRITIN", "TIBC", "IRON", "RETICCTPCT" in the last 72 hours. Sepsis Labs: No results for input(s): "PROCALCITON", "LATICACIDVEN" in the last 168 hours.  No results found for this or any previous visit (from the past 240 hours).       Radiology Studies: No results found.      Scheduled Meds:  amLODipine  10 mg Oral Daily   docusate sodium  200 mg Oral BID   donepezil  10 mg Oral Daily   enoxaparin (LOVENOX) injection  40 mg Subcutaneous Q24H   feeding supplement (GLUCERNA SHAKE)  237 mL Oral BID BM   insulin aspart  0-15 Units Subcutaneous TID AC & HS   insulin aspart  6 Units Subcutaneous TID WC   insulin glargine-yfgn  15 Units Subcutaneous Daily   multivitamin with minerals  1 tablet Oral Daily   polyethylene glycol  17 g Oral Daily   Ensure Max Protein  11 oz Oral QHS   tamsulosin  0.4 mg Oral Daily   Continuous Infusions:  lactated ringers       LOS: 0 days       Charise Killian, MD [facility] Pager [phone]  If 7PM-7AM, please contact night-coverage [email or web address] 12/21/2023, 8:28 AM

## 2023-12-21 NOTE — Progress Notes (Signed)
 OT Cancellation Note  Patient Details Name: Ryan Hicks MRN: [id number] DOB: 1950/11/29   Cancelled Treatment:    Reason Eval/Treat Not Completed: Other (comment). Upon attempt, pt with staff for care. Will re-attempt OT tx at later date/time as able.   Arman Filter., MPH, MS, OTR/L ascom [phone] 12/21/23, 11:55 AM

## 2023-12-21 NOTE — Plan of Care (Signed)
  Problem: Education: Goal: Ability to describe self-care measures that may prevent or decrease complications (Diabetes Survival Skills Education) will improve Outcome: Progressing Goal: Individualized Educational Video(s) Outcome: Progressing   Problem: Education: Goal: Individualized Educational Video(s) Outcome: Progressing   Problem: Health Behavior/Discharge Planning: Goal: Ability to identify and utilize available resources and services will improve Outcome: Progressing Goal: Ability to manage health-related needs will improve Outcome: Progressing

## 2023-12-21 NOTE — Progress Notes (Signed)
 Mobility Specialist - Progress Note   12/21/23 1209  Mobility  Activity Transferred from chair to bed;Ambulated with assistance in hallway  Level of Assistance Contact guard assist, steadying assist  Assistive Device Front wheel walker  Distance Ambulated (ft) 4 ft  Activity Response Tolerated well  Mobility visit 1 Mobility  Mobility Specialist Start Time (ACUTE ONLY) 1010  Mobility Specialist Stop Time (ACUTE ONLY) 1022  Mobility Specialist Time Calculation (min) (ACUTE ONLY) 12 min   Pt sitting in the recliner upon entry, utilizing RA. Pt requesting to transfer to bed, expressing lower back pain this date. Pt able to indep bring trunk forward and scoot towards the edge of the recliner. Pt STS to RW MinA, cuing for proper hand placement upon standing. Pt transfer to bed CGA, taking a few lateral and backwards steps toward the [facility]. Pt returned supine, left with alarm set and needs within reach. Family member present at bedside.  Zetta Bills Mobility Specialist 12/21/23 12:13 PM

## 2023-12-21 NOTE — TOC Progression Note (Addendum)
 Transition of Care [facility]) - Progression Note    Patient Details  Name: Ryan Hicks MRN: [id number] Date of Birth: 09-20-1951  [facility] CM/SW Contact  Chapman Fitch, RN Phone Number: 12/21/2023, 10:26 AM  Clinical Narrative:     Insurance appeal pending for SNF denial Wife joy has worked out a private pay agreement with [employer] and they can accept him on Monday  Theodoro Grist with [facility] to check to see if patient discharges home over the weekend, can he still admit private pay on Monday   Update:  Theodoro Grist at [employer] confirms patient can admit private pay on Monday.  Appeal still pending   Expected Discharge Plan: Skilled Nursing Facility Barriers to Discharge: Continued Medical Work up  Expected Discharge Plan and Services       Living arrangements for the past 2 months: Single Family Home Expected Discharge Date: 12/15/23                                     Social Determinants of Health (SDOH) Interventions SDOH Screenings   Food Insecurity: No Food Insecurity (12/15/2023)  Housing: Low Risk  (12/15/2023)  Transportation Needs: No Transportation Needs (12/15/2023)  Utilities: Not At Risk (12/15/2023)  Social Connections: Unknown (12/15/2023)  Tobacco Use: Low Risk  (12/14/2023)  Recent Concern: Tobacco Use - High Risk (12/05/2023)   Received from [facility]    Readmission Risk Interventions     No data to display

## 2023-12-21 NOTE — Plan of Care (Signed)
  Problem: Coping: Goal: Ability to adjust to condition or change in health will improve Outcome: Progressing   Problem: Metabolic: Goal: Ability to maintain appropriate glucose levels will improve Outcome: Progressing   Problem: Skin Integrity: Goal: Risk for impaired skin integrity will decrease Outcome: Progressing   Problem: Tissue Perfusion: Goal: Adequacy of tissue perfusion will improve Outcome: Progressing   

## 2023-12-22 DIAGNOSIS — F03918 Unspecified dementia, unspecified severity, with other behavioral disturbance: Secondary | ICD-10-CM | POA: Diagnosis not present

## 2023-12-22 LAB — COMPREHENSIVE METABOLIC PANEL
ALT: 13 U/L (ref 0–44)
AST: 17 U/L (ref 15–41)
Albumin: 2.5 g/dL — ABNORMAL LOW (ref 3.5–5.0)
Alkaline Phosphatase: 99 U/L (ref 38–126)
Anion gap: 9 (ref 5–15)
BUN: 15 mg/dL (ref 8–23)
CO2: 26 mmol/L (ref 22–32)
Calcium: 8.1 mg/dL — ABNORMAL LOW (ref 8.9–10.3)
Chloride: 95 mmol/L — ABNORMAL LOW (ref 98–111)
Creatinine, Ser: 0.82 mg/dL (ref 0.61–1.24)
GFR, Estimated: >60 mL/min (ref >60)
Glucose, Bld: 155 mg/dL — ABNORMAL HIGH (ref 70–99)
Potassium: 3.7 mmol/L (ref 3.5–5.1)
Sodium: 130 mmol/L — ABNORMAL LOW (ref 135–145)
Total Bilirubin: 1 mg/dL (ref 0.0–1.2)
Total Protein: 5.5 g/dL — ABNORMAL LOW (ref 6.5–8.1)

## 2023-12-22 LAB — GLUCOSE, CAPILLARY
Glucose-Capillary: 169 mg/dL — ABNORMAL HIGH (ref 70–99)
Glucose-Capillary: 202 mg/dL — ABNORMAL HIGH (ref 70–99)
Glucose-Capillary: 173 mg/dL — ABNORMAL HIGH (ref 70–99)
Glucose-Capillary: 145 mg/dL — ABNORMAL HIGH (ref 70–99)

## 2023-12-22 NOTE — Progress Notes (Addendum)
 PROGRESS NOTE    Ryan Hicks  NWG:[id number] DOB: Oct 13, 1951 DOA: 12/14/2023 PCP: Gracelyn Nurse, MD  Assessment & Plan:   Principal Problem:   DKA (diabetic ketoacidosis) (HCC) Active Problems:   Hypertension   Dementia without behavioral disturbance (HCC)   Fall   Hyperbilirubinemia  Assessment and Plan: DKA: resolved  DM2: poorly controlled, HbA1c 11.1. Continue on glargine, aspart, SSI w/ accuchecks    Hyperbilirubinemia: Ultrasound showed distended gallbladder w/ sludge & stones & borderline wall thickening. Resolved    Fall: PT/OT recs SNF. Pt's insurance denied SNF even after peer to peer. Pt's wife appealed the denial and stated the appeal was approved today    Dementia: without behavioral disturbance. Continue on home dose of donepezil. Continue w/ supportive care   HTN: continue on amlodipine.    PAF: currently rate controlled. Not on any rate controlling meds or chronic anticoagulation as per med rec. High fall risk. Discussed the pros vs cons on starting anticoagulation w/ pt's wife and she wants to think this over, so will hold off starting anticoagulation   Hyponatremia: labile. Will continue to monitor     DVT prophylaxis: lovenox  Code Status: full  Family Communication: Disposition Plan: unclear, pt's insurance denied SNF and pt's wife filed an appeal and pt's wife stated the appeal was approved today   Level of care: Telemetry Medical  Status is: Observation The patient remains OBS appropriate and will d/c before 2 midnights.     Consultants:    Procedures:   Antimicrobials:    Subjective: Pt is pleasantly confused currently   Objective: Vitals:   12/21/23 1543 12/21/23 1931 12/22/23 0215 12/22/23 0810  BP: (!) 138/94 131/77 135/88 125/79  Pulse: 94 94 96 95  Resp: 18 18 16 18   Temp: 99.1 F (37.3 C) 98.6 F (37 C) 98.3 F (36.8 C) 98.3 F (36.8 C)  TempSrc: Oral Oral Oral   SpO2: 100% 100% 98% 98%  Weight:      Height:         Intake/Output Summary (Last 24 hours) at 12/22/2023 0821 Last data filed at 12/22/2023 0531 Gross per 24 hour  Intake 240 ml  Output 500 ml  Net -260 ml   Filed Weights   12/14/23 1153 12/21/23 0500  Weight: 96.2 kg 87.9 kg    Examination:  General exam: appears comfortable  Respiratory system: clear breath sounds b/l  Cardiovascular system: S1 & S2+. No rubs or clicks  Gastrointestinal system: abd is soft, NT, ND & hypoactive bowel sounds Central nervous system: alert & awake. Moves all extremities  Psychiatry: Judgement and insight appears poor. Appropriate mood and affect    Data Reviewed: I have personally reviewed following labs and imaging studies  CBC: Recent Labs  Lab 12/16/23 0810 12/17/23 0001 12/18/23 0606  WBC 7.6 8.1 9.2  NEUTROABS 5.7 5.6 6.9  HGB 13.9 13.1 13.7  HCT 37.7* 35.1* 37.5*  MCV 82.3 80.9 82.4  PLT 189 187 220   Basic Metabolic Panel: Recent Labs  Lab 12/18/23 0606 12/19/23 0447 12/20/23 0506 12/21/23 0513 12/22/23 0519  NA 133* 132* 133* 131* 130*  K 3.5 3.5 3.0* 4.0 3.7  CL 98 95* 94* 95* 95*  CO2 25 25 28 25 26   GLUCOSE 166* 170* 126* 160* 155*  BUN 14 16 16 16 15   CREATININE 0.69 0.78 0.73 0.81 0.82  CALCIUM 8.1* 8.2* 7.9* 8.2* 8.1*  MG  --   --   --  1.7  --  PHOS  --   --   --  3.5  --    GFR: Estimated Creatinine Clearance: 89.4 mL/min (by C-G formula based on SCr of 0.82 mg/dL). Liver Function Tests: Recent Labs  Lab 12/20/23 0506 12/21/23 0513 12/22/23 0519  AST 17 17 17   ALT 13 14 13   ALKPHOS 87 94 99  BILITOT 1.0 1.1 1.0  PROT 5.2* 5.3* 5.5*  ALBUMIN 2.6* 2.7* 2.5*   No results for input(s): "LIPASE", "AMYLASE" in the last 168 hours. No results for input(s): "AMMONIA" in the last 168 hours. Coagulation Profile: No results for input(s): "INR", "PROTIME" in the last 168 hours. Cardiac Enzymes: No results for input(s): "CKTOTAL", "CKMB", "CKMBINDEX", "TROPONINI" in the last 168 hours. BNP (last 3  results) No results for input(s): "PROBNP" in the last 8760 hours. HbA1C: No results for input(s): "HGBA1C" in the last 72 hours. CBG: Recent Labs  Lab 12/21/23 0813 12/21/23 1130 12/21/23 1624 12/21/23 2128 12/22/23 0811  GLUCAP 165* 188* 172* 147* 169*   Lipid Profile: No results for input(s): "CHOL", "HDL", "LDLCALC", "TRIG", "CHOLHDL", "LDLDIRECT" in the last 72 hours. Thyroid Function Tests: No results for input(s): "TSH", "T4TOTAL", "FREET4", "T3FREE", "THYROIDAB" in the last 72 hours. Anemia Panel: No results for input(s): "VITAMINB12", "FOLATE", "FERRITIN", "TIBC", "IRON", "RETICCTPCT" in the last 72 hours. Sepsis Labs: No results for input(s): "PROCALCITON", "LATICACIDVEN" in the last 168 hours.  No results found for this or any previous visit (from the past 240 hours).       Radiology Studies: No results found.      Scheduled Meds:  amLODipine  10 mg Oral Daily   docusate sodium  200 mg Oral BID   donepezil  10 mg Oral Daily   enoxaparin (LOVENOX) injection  40 mg Subcutaneous Q24H   feeding supplement (GLUCERNA SHAKE)  237 mL Oral BID BM   insulin aspart  0-15 Units Subcutaneous TID AC & HS   insulin aspart  6 Units Subcutaneous TID WC   insulin glargine-yfgn  15 Units Subcutaneous Daily   multivitamin with minerals  1 tablet Oral Daily   polyethylene glycol  17 g Oral Daily   Ensure Max Protein  11 oz Oral QHS   tamsulosin  0.4 mg Oral Daily   Continuous Infusions:  lactated ringers       LOS: 0 days       Charise Killian, MD [facility] Pager [phone]  If 7PM-7AM, please contact night-coverage [email or web address] 12/22/2023, 8:21 AM

## 2023-12-22 NOTE — Plan of Care (Signed)
  Problem: Coping: Goal: Ability to adjust to condition or change in health will improve Outcome: Progressing   Problem: Health Behavior/Discharge Planning: Goal: Ability to identify and utilize available resources and services will improve Outcome: Progressing Goal: Ability to manage health-related needs will improve Outcome: Progressing   Problem: Education: Goal: Ability to describe self-care measures that may prevent or decrease complications (Diabetes Survival Skills Education) will improve Outcome: Progressing Goal: Individualized Educational Video(s) Outcome: Progressing

## 2023-12-22 NOTE — Plan of Care (Signed)

## 2023-12-23 DIAGNOSIS — F03918 Unspecified dementia, unspecified severity, with other behavioral disturbance: Secondary | ICD-10-CM | POA: Diagnosis not present

## 2023-12-23 LAB — BASIC METABOLIC PANEL
Anion gap: 9 (ref 5–15)
BUN: 16 mg/dL (ref 8–23)
CO2: 26 mmol/L (ref 22–32)
Calcium: 8.1 mg/dL — ABNORMAL LOW (ref 8.9–10.3)
Chloride: 94 mmol/L — ABNORMAL LOW (ref 98–111)
Creatinine, Ser: 0.79 mg/dL (ref 0.61–1.24)
GFR, Estimated: >60 mL/min (ref >60)
Glucose, Bld: 142 mg/dL — ABNORMAL HIGH (ref 70–99)
Potassium: 3.7 mmol/L (ref 3.5–5.1)
Sodium: 129 mmol/L — ABNORMAL LOW (ref 135–145)

## 2023-12-23 LAB — GLUCOSE, CAPILLARY
Glucose-Capillary: 173 mg/dL — ABNORMAL HIGH (ref 70–99)
Glucose-Capillary: 280 mg/dL — ABNORMAL HIGH (ref 70–99)
Glucose-Capillary: 166 mg/dL — ABNORMAL HIGH (ref 70–99)
Glucose-Capillary: 83 mg/dL (ref 70–99)

## 2023-12-23 MED ORDER — SODIUM CHLORIDE 1 G PO TABS
1.0000 g | ORAL_TABLET | Freq: Two times a day (BID) | ORAL | Status: DC
  Administered 2023-12-23 – 2023-12-24 (×2): 1 g via ORAL
  Filled 2023-12-23 (×2): qty 1

## 2023-12-23 MED ORDER — LACTULOSE 10 GM/15ML PO SOLN
20.0000 g | Freq: Two times a day (BID) | ORAL | Status: DC
  Administered 2023-12-23 (×2): 20 g via ORAL
  Filled 2023-12-23 (×3): qty 30

## 2023-12-23 NOTE — Plan of Care (Signed)
 Pt is alert to self and place. Confused to time and situation at times. Cooperative and compliant with meds and care. Awaiting placement in SNF. BS 145 at hs received 2 units of Novolog vitals stable. Lovenox for dvt prevention. No prns given.  Problem: Education: Goal: Ability to describe self-care measures that may prevent or decrease complications (Diabetes Survival Skills Education) will improve Outcome: Progressing   Problem: Coping: Goal: Ability to adjust to condition or change in health will improve Outcome: Progressing   Problem: Fluid Volume: Goal: Ability to maintain a balanced intake and output will improve Outcome: Progressing   Problem: Health Behavior/Discharge Planning: Goal: Ability to identify and utilize available resources and services will improve Outcome: Progressing Goal: Ability to manage health-related needs will improve Outcome: Progressing   Problem: Metabolic: Goal: Ability to maintain appropriate glucose levels will improve Outcome: Progressing   Problem: Nutritional: Goal: Maintenance of adequate nutrition will improve Outcome: Progressing Goal: Progress toward achieving an optimal weight will improve Outcome: Progressing   Problem: Skin Integrity: Goal: Risk for impaired skin integrity will decrease Outcome: Progressing   Problem: Tissue Perfusion: Goal: Adequacy of tissue perfusion will improve Outcome: Progressing   Problem: Education: Goal: Ability to describe self-care measures that may prevent or decrease complications (Diabetes Survival Skills Education) will improve Outcome: Progressing Goal: Individualized Educational Video(s) Outcome: Progressing   Problem: Cardiac: Goal: Ability to maintain an adequate cardiac output will improve Outcome: Progressing   Problem: Health Behavior/Discharge Planning: Goal: Ability to identify and utilize available resources and services will improve Outcome: Progressing Goal: Ability to manage  health-related needs will improve Outcome: Progressing   Problem: Fluid Volume: Goal: Ability to achieve a balanced intake and output will improve Outcome: Progressing   Problem: Metabolic: Goal: Ability to maintain appropriate glucose levels will improve Outcome: Progressing   Problem: Nutritional: Goal: Maintenance of adequate nutrition will improve Outcome: Progressing Goal: Maintenance of adequate weight for body size and type will improve Outcome: Progressing   Problem: Respiratory: Goal: Will regain and/or maintain adequate ventilation Outcome: Progressing   Problem: Urinary Elimination: Goal: Ability to achieve and maintain adequate renal perfusion and functioning will improve Outcome: Progressing   Problem: Education: Goal: Knowledge of General Education information will improve Description: Including pain rating scale, medication(s)/side effects and non-pharmacologic comfort measures Outcome: Progressing   Problem: Health Behavior/Discharge Planning: Goal: Ability to manage health-related needs will improve Outcome: Progressing   Problem: Clinical Measurements: Goal: Ability to maintain clinical measurements within normal limits will improve Outcome: Progressing Goal: Will remain free from infection Outcome: Progressing Goal: Diagnostic test results will improve Outcome: Progressing Goal: Respiratory complications will improve Outcome: Progressing Goal: Cardiovascular complication will be avoided Outcome: Progressing   Problem: Activity: Goal: Risk for activity intolerance will decrease Outcome: Progressing   Problem: Nutrition: Goal: Adequate nutrition will be maintained Outcome: Progressing   Problem: Coping: Goal: Level of anxiety will decrease Outcome: Progressing   Problem: Elimination: Goal: Will not experience complications related to bowel motility Outcome: Progressing Goal: Will not experience complications related to urinary  retention Outcome: Progressing   Problem: Pain Managment: Goal: General experience of comfort will improve and/or be controlled Outcome: Progressing   Problem: Safety: Goal: Ability to remain free from injury will improve Outcome: Progressing   Problem: Skin Integrity: Goal: Risk for impaired skin integrity will decrease Outcome: Progressing

## 2023-12-23 NOTE — Progress Notes (Signed)
 PROGRESS NOTE    Ryan Hicks  [id number]:[id number] DOB: 11/23/1950 DOA: 12/14/2023 PCP: Gracelyn Nurse, MD  Assessment & Plan:   Principal Problem:   DKA (diabetic ketoacidosis) (HCC) Active Problems:   Hypertension   Dementia without behavioral disturbance (HCC)   Fall   Hyperbilirubinemia  Assessment and Plan: DKA: resolved  DM2: poorly controlled, HbA1c 11.1. Continue on glargine, aspart, SSI w/ accuchecks    Hyperbilirubinemia: Ultrasound showed distended gallbladder w/ sludge & stones & borderline wall thickening. Resolved    Fall: PT/OT recs SNF. Pt's insurance denied SNF even after peer to peer. Pt's wife appealed the denial and stated the appeal was approved on 12/22/23   Dementia: continue on home dose of donepezil. Continue w/ supportive care    HTN: continue on amlodipine    PAF: currently rate controlled. Not on any rate controlling meds or chronic anticoagulation as per med rec. High fall risk. Discussed the pros vs cons on starting anticoagulation w/ pt's wife and she wants to think this over, so will hold off starting anticoagulation   Hyponatremia: labile. Will continue to monitor     DVT prophylaxis: lovenox  Code Status: full  Family Communication: Disposition Plan: unclear, pt's insurance denied SNF and pt's wife filed an appeal and pt's wife stated the appeal was approved 12/22/23  Level of care: Telemetry Medical  Status is: Observation The patient remains OBS appropriate and will d/c before 2 midnights.     Consultants:    Procedures:   Antimicrobials:    Subjective: Pt is confused   Objective: Vitals:   12/22/23 1937 12/23/23 0338 12/23/23 0428 12/23/23 0659  BP: (!) 140/80 134/89  (!) 145/88  Pulse: 94 97  96  Resp: 20 20  16   Temp: 98.5 F (36.9 C) 98.3 F (36.8 C)  98.4 F (36.9 C)  TempSrc: Oral Oral  Oral  SpO2: 98% 97%  98%  Weight:   87.9 kg   Height:        Intake/Output Summary (Last 24 hours) at 12/23/2023  0807 Last data filed at 12/23/2023 0700 Gross per 24 hour  Intake 120 ml  Output 450 ml  Net -330 ml   Filed Weights   12/14/23 1153 12/21/23 0500 12/23/23 0428  Weight: 96.2 kg 87.9 kg 87.9 kg    Examination:  General exam: appears agitated  Respiratory system: clear breath sounds b/l  Cardiovascular system: S1/S2+. No rubs or clicks  Gastrointestinal system: abd is soft, NT, ND & hypoactive bowel sounds  Central nervous system: alert & awake. Moves all extremities  Psychiatry: judgement and insight appears poor. Agitated mood and affect    Data Reviewed: I have personally reviewed following labs and imaging studies  CBC: Recent Labs  Lab 12/16/23 0810 12/17/23 0001 12/18/23 0606  WBC 7.6 8.1 9.2  NEUTROABS 5.7 5.6 6.9  HGB 13.9 13.1 13.7  HCT 37.7* 35.1* 37.5*  MCV 82.3 80.9 82.4  PLT 189 187 220   Basic Metabolic Panel: Recent Labs  Lab 12/19/23 0447 12/20/23 0506 12/21/23 0513 12/22/23 0519 12/23/23 0418  NA 132* 133* 131* 130* 129*  K 3.5 3.0* 4.0 3.7 3.7  CL 95* 94* 95* 95* 94*  CO2 25 28 25 26 26   GLUCOSE 170* 126* 160* 155* 142*  BUN 16 16 16 15 16   CREATININE 0.78 0.73 0.81 0.82 0.79  CALCIUM 8.2* 7.9* 8.2* 8.1* 8.1*  MG  --   --  1.7  --   --   PHOS  --   --  3.5  --   --    GFR: Estimated Creatinine Clearance: 91.6 mL/min (by C-G formula based on SCr of 0.79 mg/dL). Liver Function Tests: Recent Labs  Lab 12/20/23 0506 12/21/23 0513 12/22/23 0519  AST 17 17 17   ALT 13 14 13   ALKPHOS 87 94 99  BILITOT 1.0 1.1 1.0  PROT 5.2* 5.3* 5.5*  ALBUMIN 2.6* 2.7* 2.5*   No results for input(s): "LIPASE", "AMYLASE" in the last 168 hours. No results for input(s): "AMMONIA" in the last 168 hours. Coagulation Profile: No results for input(s): "INR", "PROTIME" in the last 168 hours. Cardiac Enzymes: No results for input(s): "CKTOTAL", "CKMB", "CKMBINDEX", "TROPONINI" in the last 168 hours. BNP (last 3 results) No results for input(s): "PROBNP" in  the last 8760 hours. HbA1C: No results for input(s): "HGBA1C" in the last 72 hours. CBG: Recent Labs  Lab 12/21/23 2128 12/22/23 0811 12/22/23 1255 12/22/23 1654 12/22/23 2228  GLUCAP 147* 169* 202* 173* 145*   Lipid Profile: No results for input(s): "CHOL", "HDL", "LDLCALC", "TRIG", "CHOLHDL", "LDLDIRECT" in the last 72 hours. Thyroid Function Tests: No results for input(s): "TSH", "T4TOTAL", "FREET4", "T3FREE", "THYROIDAB" in the last 72 hours. Anemia Panel: No results for input(s): "VITAMINB12", "FOLATE", "FERRITIN", "TIBC", "IRON", "RETICCTPCT" in the last 72 hours. Sepsis Labs: No results for input(s): "PROCALCITON", "LATICACIDVEN" in the last 168 hours.  No results found for this or any previous visit (from the past 240 hours).       Radiology Studies: No results found.      Scheduled Meds:  amLODipine  10 mg Oral Daily   docusate sodium  200 mg Oral BID   donepezil  10 mg Oral Daily   enoxaparin (LOVENOX) injection  40 mg Subcutaneous Q24H   feeding supplement (GLUCERNA SHAKE)  237 mL Oral BID BM   insulin aspart  0-15 Units Subcutaneous TID AC & HS   insulin aspart  6 Units Subcutaneous TID WC   insulin glargine-yfgn  15 Units Subcutaneous Daily   multivitamin with minerals  1 tablet Oral Daily   polyethylene glycol  17 g Oral Daily   Ensure Max Protein  11 oz Oral QHS   tamsulosin  0.4 mg Oral Daily   Continuous Infusions:  lactated ringers       LOS: 0 days       Charise Killian, MD [facility] Pager [phone]  If 7PM-7AM, please contact night-coverage [email or web address] 12/23/2023, 8:07 AM

## 2023-12-24 DIAGNOSIS — E1169 Type 2 diabetes mellitus with other specified complication: Secondary | ICD-10-CM | POA: Diagnosis not present

## 2023-12-24 LAB — BASIC METABOLIC PANEL
Anion gap: 13 (ref 5–15)
BUN: 16 mg/dL (ref 8–23)
CO2: 24 mmol/L (ref 22–32)
Calcium: 8.1 mg/dL — ABNORMAL LOW (ref 8.9–10.3)
Chloride: 94 mmol/L — ABNORMAL LOW (ref 98–111)
Creatinine, Ser: 0.82 mg/dL (ref 0.61–1.24)
GFR, Estimated: >60 mL/min (ref >60)
Glucose, Bld: 121 mg/dL — ABNORMAL HIGH (ref 70–99)
Potassium: 3.6 mmol/L (ref 3.5–5.1)
Sodium: 131 mmol/L — ABNORMAL LOW (ref 135–145)

## 2023-12-24 LAB — GLUCOSE, CAPILLARY: Glucose-Capillary: 262 mg/dL — ABNORMAL HIGH (ref 70–99)

## 2023-12-24 MED ORDER — TRAMADOL HCL 50 MG PO TABS: 50.0000 mg | ORAL_TABLET | Freq: Four times a day (QID) | ORAL | 0 refills | Status: AC | PRN

## 2023-12-24 MED ORDER — AMLODIPINE BESYLATE 10 MG PO TABS: 10.0000 mg | ORAL_TABLET | Freq: Every day | ORAL | 0 refills | Status: AC

## 2023-12-24 NOTE — Discharge Summary (Addendum)
 Physician Discharge Summary  [name]:[id number] DOB: 1951/02/10 DOA: 12/14/2023  PCP: Gracelyn Nurse, MD  Admit date: 12/14/2023 Discharge date: 12/24/2023  Admitted From: home  Disposition:  home   Recommendations for Outpatient Follow-up:  Follow up with PCP in 1-2 weeks   Home Health: no  Equipment/Devices:  Discharge Condition: stable  CODE STATUS: full  Diet recommendation: Heart Healthy / Carb Modified  Brief/Interim Summary: HPI was taken from Dr. Alvester Morin:  Ryan Hicks is a 73 y.o. male with medical history significant of advanced dementia, HLD, prior CVA, heterozygous factor V leiden mutation. Medical noncompliance presenting w/ DKA, fall. Limited history in setting of dementia and DKA. Histroy primarily from patient's wife  Ryan Hicks.  Per report, patient with recurrent falls over the past 4 to 5 days while patient and family were out of town in [place].  Had to return home because of falls. Also with decreased p.o. intake.  Wife reports patient being noncompliant with all medications in the setting of advanced dementia.  Intermittently taking glipizide as well as Aricept.  Has had decreased p.o. intake over similar timeframe.  Patient has had difficulty with regular eating.  No reports of fevers or chills.  No reported abdominal pain.  Had episode of emesis yesterday that was noted by wife after patient fell asleep in the recliner.  There is a large amount of dark emesis on the floor after drinking prune juice earlier in the day.  Does not check blood sugars on a regular basis.  Was evaluated by PCP within the past few days because of recent falls.  Imaging grossly stable. Presented to the ER afebrile, hemodynamically stable.  Satting well on room air.  White count 12.8, hemoglobin 16.6, platelets 325, blood sugar into the 400s.  Beta hydroxybutyrate of 6.5.  VBG with a pH of 7.24.  Bicarb 14.  Creatinine 1.2.  T. bili 2.  Urinalysis pending.  CT head and chest x-ray grossly  stable.  Discharge Diagnoses:  Principal Problem:   DKA (diabetic ketoacidosis) (HCC) Active Problems:   Hypertension   Dementia without behavioral disturbance (HCC)   Fall   Hyperbilirubinemia DKA: resolved   DM2: poorly controlled, HbA1c 11.1. Continue on home anti-DM meds at d/c: Glipizide 10 mg BID, Actos 15 mg daily, and Metformin 1000 mg BID for DM    Hyperbilirubinemia: Ultrasound showed distended gallbladder w/ sludge & stones & borderline wall thickening. Resolved    Fall: PT/OT recs SNF. Pt's insurance denied SNF even after peer to peer. Pt's wife appealed the denial and stated the appeal was approved on 12/22/23   Dementia: continue on home dose of donepezil. Continue w/ supportive care    HTN: continue on amlodipine    PAF: currently rate controlled. Not on any rate controlling meds or chronic anticoagulation as per med rec. High fall risk. Discussed the pros vs cons on starting anticoagulation w/ pt's wife and she wants to think this over, so will hold off starting anticoagulation    Hyponatremia: labile. Will continue to monitor    Discharge Instructions  Discharge Instructions     Diet - low sodium heart healthy   Complete by: As directed    Diet Carb Modified   Complete by: As directed    Discharge instructions   Complete by: As directed    F/u w/ PCP in 1-2 weeks.   Increase activity slowly   Complete by: As directed       Allergies as of 12/24/2023  No Known Allergies      Medication List     TAKE these medications    amLODipine 10 MG tablet Commonly known as: NORVASC Take 1 tablet (10 mg total) by mouth daily. Start taking on: December 25, 2023   donepezil 10 MG tablet Commonly known as: ARICEPT Take 10 mg by mouth daily.   glucosamine-chondroitin 500-400 MG tablet Take 1 tablet by mouth 3 (three) times daily.   Magnesium 250 MG Tabs Take by mouth.   pioglitazone 15 MG tablet Commonly known as: ACTOS Take 15 mg by mouth daily.    tamsulosin 0.4 MG Caps capsule Commonly known as: FLOMAX Take 1 capsule (0.4 mg total) by mouth daily.   traMADol 50 MG tablet Commonly known as: ULTRAM Take 1 tablet (50 mg total) by mouth every 6 (six) hours as needed for up to 1 day for moderate pain (pain score 4-6).        Contact information for follow-up providers     Gracelyn Nurse, MD Follow up.   Specialty: Internal Medicine Why: F/u in 1-2 weeks Contact information: [address] [facility] [place] [address] [phone]              Contact information for after-discharge care     Destination     [facility] Preferred [facility] .   Service: Skilled Nursing Contact information: [address] [place] [address] [phone]                    No Known Allergies  Consultations:   Procedures/Studies: DG Lumbar Spine 2-3 Views Result Date: 12/15/2023 CLINICAL DATA:  Fall. EXAM: LUMBAR SPINE - 2-3 VIEW COMPARISON:  None Available. FINDINGS: There are 5 nonrib-bearing lumbar vertebrae. There is loss of lumbar lordosis, which may be on the basis of positioning or due to muscle spasm. No spondylolisthesis. Vertebral body heights are maintained. No aggressive osseous lesion. Mild-moderate multilevel degenerative changes in the form of reduced intervertebral disc height, endplate sclerosis/irregularity, facet arthropathy and marginal osteophyte formation. There are diffuse parallel paravertebral syndesmophytes, favored to represent ankylosing spondylitis. There is suspected right sacroiliac joint fusion. Visualized soft tissues are within normal limits. IMPRESSION: 1. No acute osseous abnormality of the lumbar spine. 2. Mild-to-moderate degenerative changes. 3. Findings favor ankylosing spondylitis. Electronically Signed   By: Jules Schick M.D.   On: 12/15/2023 11:52   US Abdomen Limited RUQ  (LIVER/GB) Result Date: 12/14/2023 CLINICAL DATA:  Hyperbilirubinemia EXAM: ULTRASOUND ABDOMEN LIMITED RIGHT UPPER QUADRANT COMPARISON:  None Available. FINDINGS: Gallbladder: Distended gallbladder with sludge and stones. Borderline wall thickening of 3 mm. No adjacent fluid. No reported sonographic Murphy's sign. Common bile duct: Diameter: 2 mm Liver: No focal lesion identified. Within normal limits in parenchymal echogenicity. Portal vein is patent on color Doppler imaging with normal direction of blood flow towards the liver. Other: None. IMPRESSION: Distended gallbladder with sludge and stones. Borderline wall thickening. No further sonographic evidence of acute cholecystitis or ductal dilatation. Electronically Signed   By: Karen Kays M.D.   On: 12/14/2023 16:25   CT Head Wo Contrast Result Date: 12/14/2023 CLINICAL DATA:  Weakness.  Fall. EXAM: CT HEAD WITHOUT CONTRAST TECHNIQUE: Contiguous axial images were obtained from the base of the skull through the vertex without intravenous contrast. RADIATION DOSE REDUCTION: This exam was performed according to the departmental dose-optimization program which includes automated exposure control, adjustment of the mA and/or kV  according to patient size and/or use of iterative reconstruction technique. COMPARISON:  CT scan head from 02/13/2012. FINDINGS: Brain: No evidence of acute infarction, hemorrhage, hydrocephalus, extra-axial collection or mass lesion/mass effect. There is bilateral periventricular hypodensity, which is non-specific but most likely seen in the settings of microvascular ischemic changes. Mild in extent. Otherwise normal appearance of brain parenchyma. Cerebral volume loss with enlargement of the ventricles. Vascular: No hyperdense vessel or unexpected calcification. Intracranial arteriosclerosis. Skull: Normal. Negative for fracture or focal lesion. Sinuses/Orbits: No acute finding. Other: Visualized mastoid air cells are unremarkable. No  mastoid effusion. IMPRESSION: *No acute intracranial abnormality. Electronically Signed   By: Jules Schick M.D.   On: 12/14/2023 13:47   DG Chest 2 View Result Date: 12/14/2023 CLINICAL DATA:  Weakness.  Multiple falls. EXAM: CHEST - 2 VIEW COMPARISON:  None Available. FINDINGS: Linear area of focal scarring/atelectasis noted overlying the left lateral costophrenic angle. Bilateral lung fields are otherwise clear. Bilateral costophrenic angles are clear. Normal cardio-mediastinal silhouette. No acute osseous abnormalities. The soft tissues are within normal limits. IMPRESSION: No active cardiopulmonary disease. Electronically Signed   By: Jules Schick M.D.   On: 12/14/2023 13:43   (Echo, Carotid, EGD, Colonoscopy, ERCP)    Subjective:   Discharge Exam: Vitals:   12/24/23 0345 12/24/23 0807  BP: 137/80 (!) 143/75  Pulse: 87 95  Resp: 16 18  Temp: 98.1 F (36.7 C) 98.6 F (37 C)  SpO2: 99% 97%   Vitals:   12/23/23 1934 12/24/23 0345 12/24/23 0351 12/24/23 0807  BP: 130/79 137/80  (!) 143/75  Pulse: 85 87  95  Resp: 16 16  18   Temp: 98 F (36.7 C) 98.1 F (36.7 C)  98.6 F (37 C)  TempSrc: Oral Oral  Oral  SpO2: 99% 99%  97%  Weight:   88.1 kg   Height:        General: Pt is alert, awake, not in acute distress Cardiovascular: S1/S2 +, no rubs, no gallops Respiratory: CTA bilaterally, no wheezing, no rhonchi Abdominal: Soft, NT, ND, bowel sounds + Extremities: no edema, no cyanosis    The results of significant diagnostics from this hospitalization (including imaging, microbiology, ancillary and laboratory) are listed below for reference.     Microbiology: No results found for this or any previous visit (from the past 240 hours).   Labs: BNP (last 3 results) No results for input(s): "BNP" in the last 8760 hours. Basic Metabolic Panel: Recent Labs  Lab 12/20/23 0506 12/21/23 0513 12/22/23 0519 12/23/23 0418 12/24/23 0354  NA 133* 131* 130* 129* 131*  K  3.0* 4.0 3.7 3.7 3.6  CL 94* 95* 95* 94* 94*  CO2 28 25 26 26 24   GLUCOSE 126* 160* 155* 142* 121*  BUN 16 16 15 16 16   CREATININE 0.73 0.81 0.82 0.79 0.82  CALCIUM 7.9* 8.2* 8.1* 8.1* 8.1*  MG  --  1.7  --   --   --   PHOS  --  3.5  --   --   --    Liver Function Tests: Recent Labs  Lab 12/20/23 0506 12/21/23 0513 12/22/23 0519  AST 17 17 17   ALT 13 14 13   ALKPHOS 87 94 99  BILITOT 1.0 1.1 1.0  PROT 5.2* 5.3* 5.5*  ALBUMIN 2.6* 2.7* 2.5*   No results for input(s): "LIPASE", "AMYLASE" in the last 168 hours. No results for input(s): "AMMONIA" in the last 168 hours. CBC: Recent Labs  Lab 12/18/23 0606  WBC 9.2  NEUTROABS 6.9  HGB 13.7  HCT 37.5*  MCV 82.4  PLT 220   Cardiac Enzymes: No results for input(s): "CKTOTAL", "CKMB", "CKMBINDEX", "TROPONINI" in the last 168 hours. BNP: Invalid input(s): "POCBNP" CBG: Recent Labs  Lab 12/23/23 0807 12/23/23 1205 12/23/23 1608 12/23/23 2111 12/24/23 0805  GLUCAP 173* 280* 166* 83 262*   D-Dimer No results for input(s): "DDIMER" in the last 72 hours. Hgb A1c No results for input(s): "HGBA1C" in the last 72 hours. Lipid Profile No results for input(s): "CHOL", "HDL", "LDLCALC", "TRIG", "CHOLHDL", "LDLDIRECT" in the last 72 hours. Thyroid function studies No results for input(s): "TSH", "T4TOTAL", "T3FREE", "THYROIDAB" in the last 72 hours.  Invalid input(s): "FREET3" Anemia work up No results for input(s): "VITAMINB12", "FOLATE", "FERRITIN", "TIBC", "IRON", "RETICCTPCT" in the last 72 hours. Urinalysis    Component Value Date/Time   COLORURINE YELLOW (A) 12/14/2023 1607   APPEARANCEUR CLEAR (A) 12/14/2023 1607   APPEARANCEUR Clear 11/22/2022 1406   LABSPEC 1.027 12/14/2023 1607   PHURINE 5.0 12/14/2023 1607   GLUCOSEU >=500 (A) 12/14/2023 1607   HGBUR MODERATE (A) 12/14/2023 1607   BILIRUBINUR NEGATIVE 12/14/2023 1607   BILIRUBINUR Negative 11/22/2022 1406   KETONESUR 20 (A) 12/14/2023 1607   PROTEINUR 100  (A) 12/14/2023 1607   NITRITE NEGATIVE 12/14/2023 1607   LEUKOCYTESUR NEGATIVE 12/14/2023 1607   Sepsis Labs Recent Labs  Lab 12/18/23 0606  WBC 9.2   Microbiology No results found for this or any previous visit (from the past 240 hours).   Time coordinating discharge: Over 30 minutes  SIGNED:   Charise Killian, MD  [facility] 12/24/2023, 10:20 AM Pager   If 7PM-7AM, please contact night-coverage [email or web address]

## 2023-12-24 NOTE — Plan of Care (Signed)

## 2023-12-24 NOTE — TOC Transition Note (Signed)
 Transition of Care [facility]) - Discharge Note   Patient Details  Name: Ryan Hicks MRN: [id number] Date of Birth: 04-Mar-1951  Transition of Care [facility]) CM/SW Contact:  Chapman Fitch, RN Phone Number: 12/24/2023, 10:57 AM   Clinical Narrative:     Insurance has approved for rehab at [facility]  Patient will DC to: [facility] DC date: 12/24/23  Family notified: Wife Software engineer by: [employer]  Per MD patient ready for DC to . RN, patient, patient's family, and facility notified of DC. Discharge Summary sent to facility. RN given number for report. DC packet on chart. Ambulance transport requested for patient.  TOC signing off.     Barriers to Discharge: Continued Medical Work up   Patient Goals and CMS Choice   CMS Medicare.gov Compare Post Acute Care list provided to:: Patient Choice offered to / list presented to : Patient      Discharge Placement                       Discharge Plan and Services Additional resources added to the After Visit Summary for                                       Social Drivers of Health (SDOH) Interventions SDOH Screenings   Food Insecurity: No Food Insecurity (12/15/2023)  Housing: Low Risk  (12/15/2023)  Transportation Needs: No Transportation Needs (12/15/2023)  Utilities: Not At Risk (12/15/2023)  Social Connections: Unknown (12/15/2023)  Tobacco Use: High Risk (12/21/2023)   Received from [facility]     Readmission Risk Interventions     No data to display

## 2023-12-26 ENCOUNTER — Ambulatory Visit: Payer: Self-pay | Admitting: Urology

## 2024-02-28 ENCOUNTER — Other Ambulatory Visit: Payer: Self-pay | Admitting: Urology

## 2024-09-29 ENCOUNTER — Ambulatory Visit: Payer: Medicare PPO | Admitting: Dermatology
